# Patient Record
Sex: Male | Born: 1965
Health system: Southern US, Community
[De-identification: ages and names within clinical notes are randomized; demographics above are authoritative.]

## PROBLEM LIST (undated history)

## (undated) DIAGNOSIS — I10 Essential (primary) hypertension: Secondary | ICD-10-CM

## (undated) DIAGNOSIS — G4700 Insomnia, unspecified: Secondary | ICD-10-CM

## (undated) DIAGNOSIS — K52831 Collagenous colitis: Secondary | ICD-10-CM

## (undated) DIAGNOSIS — T7840XA Allergy, unspecified, initial encounter: Secondary | ICD-10-CM

## (undated) DIAGNOSIS — Z889 Allergy status to unspecified drugs, medicaments and biological substances status: Secondary | ICD-10-CM

## (undated) DIAGNOSIS — K859 Acute pancreatitis without necrosis or infection, unspecified: Secondary | ICD-10-CM

## (undated) DIAGNOSIS — N200 Calculus of kidney: Secondary | ICD-10-CM

## (undated) HISTORY — PX: CHOLECYSTECTOMY: SHX55

## (undated) HISTORY — DX: Essential (primary) hypertension: I10

## (undated) HISTORY — PX: COSMETIC SURGERY: SHX468

## (undated) HISTORY — DX: Collagenous colitis: K52.831

## (undated) HISTORY — DX: Insomnia, unspecified: G47.00

## (undated) HISTORY — PX: MANDIBLE SURGERY: SHX707

## (undated) HISTORY — DX: Allergy status to unspecified drugs, medicaments and biological substances: Z88.9

## (undated) HISTORY — DX: Allergy, unspecified, initial encounter: T78.40XA

## (undated) HISTORY — DX: Acute pancreatitis without necrosis or infection, unspecified: K85.90

## (undated) HISTORY — DX: Calculus of kidney: N20.0

---

## 2005-08-09 ENCOUNTER — Inpatient Hospital Stay (HOSPITAL_COMMUNITY): Admission: AD | Admit: 2005-08-09 | Discharge: 2005-08-11 | Payer: Self-pay | Admitting: Internal Medicine

## 2005-08-09 ENCOUNTER — Ambulatory Visit: Payer: Self-pay | Admitting: Internal Medicine

## 2005-08-18 ENCOUNTER — Ambulatory Visit: Payer: Self-pay | Admitting: Family Medicine

## 2005-08-31 ENCOUNTER — Encounter: Admission: RE | Admit: 2005-08-31 | Discharge: 2005-11-29 | Payer: Self-pay | Admitting: Internal Medicine

## 2005-09-22 ENCOUNTER — Ambulatory Visit: Payer: Self-pay | Admitting: Family Medicine

## 2005-10-27 ENCOUNTER — Ambulatory Visit: Payer: Self-pay | Admitting: Family Medicine

## 2005-11-24 ENCOUNTER — Ambulatory Visit: Payer: Self-pay | Admitting: Family Medicine

## 2006-08-05 ENCOUNTER — Ambulatory Visit: Payer: Self-pay | Admitting: Family Medicine

## 2006-12-20 ENCOUNTER — Ambulatory Visit: Payer: Self-pay | Admitting: Gastroenterology

## 2006-12-20 LAB — CONVERTED CEMR LAB
ALT: 14 units/L (ref 0–40)
AST: 14 units/L (ref 0–37)
Alkaline Phosphatase: 56 units/L (ref 39–117)
BUN: 15 mg/dL (ref 6–23)
Basophils Relative: 0.8 % (ref 0.0–1.0)
Bilirubin, Direct: 0.1 mg/dL (ref 0.0–0.3)
CO2: 30 meq/L (ref 19–32)
Calcium: 9.1 mg/dL (ref 8.4–10.5)
Chloride: 105 meq/L (ref 96–112)
Eosinophils Absolute: 0.1 10*3/uL (ref 0.0–0.6)
Eosinophils Relative: 2.4 % (ref 0.0–5.0)
GFR calc Af Amer: 108 mL/min
GFR calc non Af Amer: 89 mL/min
Glucose, Bld: 93 mg/dL (ref 70–99)
Lymphocytes Relative: 22.4 % (ref 12.0–46.0)
Monocytes Relative: 7.4 % (ref 3.0–11.0)
Neutro Abs: 4.1 10*3/uL (ref 1.4–7.7)
Platelets: 201 10*3/uL (ref 150–400)
RBC: 4.99 M/uL (ref 4.22–5.81)
WBC: 5.9 10*3/uL (ref 4.5–10.5)

## 2007-01-07 HISTORY — PX: OTHER SURGICAL HISTORY: SHX169

## 2007-02-01 ENCOUNTER — Ambulatory Visit: Payer: Self-pay | Admitting: Gastroenterology

## 2007-02-15 ENCOUNTER — Ambulatory Visit: Payer: Self-pay | Admitting: Family Medicine

## 2007-02-15 ENCOUNTER — Ambulatory Visit: Payer: Self-pay | Admitting: Internal Medicine

## 2007-02-15 LAB — CONVERTED CEMR LAB
Albumin: 3.5 g/dL (ref 3.5–5.2)
Alkaline Phosphatase: 50 units/L (ref 39–117)
BUN: 15 mg/dL (ref 6–23)
Basophils Absolute: 0 10*3/uL (ref 0.0–0.1)
Eosinophils Absolute: 0.2 10*3/uL (ref 0.0–0.6)
GFR calc Af Amer: 87 mL/min
GFR calc non Af Amer: 72 mL/min
HCT: 43.5 % (ref 39.0–52.0)
Hemoglobin: 15.1 g/dL (ref 13.0–17.0)
Lipase: 21 units/L (ref 11.0–59.0)
MCHC: 34.8 g/dL (ref 30.0–36.0)
MCV: 88.5 fL (ref 78.0–100.0)
Monocytes Absolute: 0.9 10*3/uL — ABNORMAL HIGH (ref 0.2–0.7)
Monocytes Relative: 7.8 % (ref 3.0–11.0)
Neutro Abs: 8.9 10*3/uL — ABNORMAL HIGH (ref 1.4–7.7)
Neutrophils Relative %: 77.8 % — ABNORMAL HIGH (ref 43.0–77.0)
Potassium: 4.2 meq/L (ref 3.5–5.1)
Sodium: 141 meq/L (ref 135–145)
Total Bilirubin: 1.1 mg/dL (ref 0.3–1.2)

## 2007-02-21 ENCOUNTER — Ambulatory Visit: Payer: Self-pay | Admitting: Gastroenterology

## 2007-03-20 ENCOUNTER — Ambulatory Visit: Payer: Self-pay | Admitting: Cardiology

## 2007-03-22 ENCOUNTER — Ambulatory Visit: Payer: Self-pay | Admitting: Gastroenterology

## 2007-05-15 ENCOUNTER — Ambulatory Visit: Payer: Self-pay | Admitting: Gastroenterology

## 2007-05-23 ENCOUNTER — Inpatient Hospital Stay (HOSPITAL_COMMUNITY): Admission: EM | Admit: 2007-05-23 | Discharge: 2007-05-28 | Payer: Self-pay | Admitting: Emergency Medicine

## 2007-05-26 ENCOUNTER — Ambulatory Visit: Payer: Self-pay | Admitting: Gastroenterology

## 2007-06-09 ENCOUNTER — Ambulatory Visit: Payer: Self-pay | Admitting: Gastroenterology

## 2007-07-21 ENCOUNTER — Encounter (INDEPENDENT_AMBULATORY_CARE_PROVIDER_SITE_OTHER): Payer: Self-pay | Admitting: Internal Medicine

## 2007-07-21 ENCOUNTER — Encounter: Payer: Self-pay | Admitting: Gastroenterology

## 2007-07-21 ENCOUNTER — Ambulatory Visit: Payer: Self-pay | Admitting: Gastroenterology

## 2007-07-21 LAB — HM COLONOSCOPY: HM Colonoscopy: NORMAL

## 2007-09-12 ENCOUNTER — Ambulatory Visit: Payer: Self-pay | Admitting: Gastroenterology

## 2007-10-25 DIAGNOSIS — R197 Diarrhea, unspecified: Secondary | ICD-10-CM | POA: Insufficient documentation

## 2008-01-01 DIAGNOSIS — K859 Acute pancreatitis without necrosis or infection, unspecified: Secondary | ICD-10-CM | POA: Insufficient documentation

## 2008-01-01 DIAGNOSIS — M359 Systemic involvement of connective tissue, unspecified: Secondary | ICD-10-CM | POA: Insufficient documentation

## 2008-01-01 DIAGNOSIS — N200 Calculus of kidney: Secondary | ICD-10-CM | POA: Insufficient documentation

## 2008-05-16 ENCOUNTER — Telehealth: Payer: Self-pay | Admitting: Gastroenterology

## 2008-05-16 ENCOUNTER — Inpatient Hospital Stay (HOSPITAL_COMMUNITY): Admission: AD | Admit: 2008-05-16 | Discharge: 2008-05-23 | Payer: Self-pay | Admitting: Internal Medicine

## 2008-05-16 ENCOUNTER — Ambulatory Visit: Payer: Self-pay | Admitting: Internal Medicine

## 2008-05-16 DIAGNOSIS — J189 Pneumonia, unspecified organism: Secondary | ICD-10-CM | POA: Insufficient documentation

## 2008-05-17 ENCOUNTER — Encounter (INDEPENDENT_AMBULATORY_CARE_PROVIDER_SITE_OTHER): Payer: Self-pay | Admitting: Internal Medicine

## 2008-05-21 ENCOUNTER — Ambulatory Visit: Payer: Self-pay | Admitting: Gastroenterology

## 2008-05-21 ENCOUNTER — Encounter (INDEPENDENT_AMBULATORY_CARE_PROVIDER_SITE_OTHER): Payer: Self-pay | Admitting: Internal Medicine

## 2008-05-22 ENCOUNTER — Encounter (INDEPENDENT_AMBULATORY_CARE_PROVIDER_SITE_OTHER): Payer: Self-pay | Admitting: Internal Medicine

## 2008-05-23 ENCOUNTER — Encounter (INDEPENDENT_AMBULATORY_CARE_PROVIDER_SITE_OTHER): Payer: Self-pay | Admitting: Internal Medicine

## 2008-05-29 ENCOUNTER — Ambulatory Visit (HOSPITAL_COMMUNITY): Admission: RE | Admit: 2008-05-29 | Discharge: 2008-05-29 | Payer: Self-pay | Admitting: Family Medicine

## 2008-05-29 ENCOUNTER — Ambulatory Visit: Payer: Self-pay | Admitting: Family Medicine

## 2008-05-29 DIAGNOSIS — B370 Candidal stomatitis: Secondary | ICD-10-CM | POA: Insufficient documentation

## 2008-05-30 ENCOUNTER — Encounter (INDEPENDENT_AMBULATORY_CARE_PROVIDER_SITE_OTHER): Payer: Self-pay | Admitting: Internal Medicine

## 2008-06-06 LAB — CONVERTED CEMR LAB
CO2: 30 meq/L (ref 19–32)
Calcium: 9.3 mg/dL (ref 8.4–10.5)
Chloride: 106 meq/L (ref 96–112)
Potassium: 4.4 meq/L (ref 3.5–5.1)
Sodium: 142 meq/L (ref 135–145)

## 2008-06-12 ENCOUNTER — Telehealth (INDEPENDENT_AMBULATORY_CARE_PROVIDER_SITE_OTHER): Payer: Self-pay | Admitting: Internal Medicine

## 2010-05-01 ENCOUNTER — Telehealth: Payer: Self-pay | Admitting: Gastroenterology

## 2010-05-01 ENCOUNTER — Telehealth: Payer: Self-pay | Admitting: Internal Medicine

## 2010-05-01 ENCOUNTER — Ambulatory Visit: Payer: Self-pay | Admitting: Internal Medicine

## 2010-05-02 ENCOUNTER — Inpatient Hospital Stay (HOSPITAL_COMMUNITY): Admission: EM | Admit: 2010-05-02 | Discharge: 2010-05-05 | Payer: Self-pay | Admitting: Emergency Medicine

## 2010-08-25 ENCOUNTER — Telehealth (INDEPENDENT_AMBULATORY_CARE_PROVIDER_SITE_OTHER): Payer: Self-pay | Admitting: *Deleted

## 2010-08-26 ENCOUNTER — Encounter (INDEPENDENT_AMBULATORY_CARE_PROVIDER_SITE_OTHER): Payer: Self-pay | Admitting: *Deleted

## 2010-08-31 ENCOUNTER — Ambulatory Visit: Payer: Self-pay | Admitting: Family Medicine

## 2010-08-31 DIAGNOSIS — R05 Cough: Secondary | ICD-10-CM

## 2010-08-31 DIAGNOSIS — R059 Cough, unspecified: Secondary | ICD-10-CM | POA: Insufficient documentation

## 2010-10-23 ENCOUNTER — Ambulatory Visit: Payer: Self-pay | Admitting: Family Medicine

## 2010-10-23 DIAGNOSIS — J320 Chronic maxillary sinusitis: Secondary | ICD-10-CM | POA: Insufficient documentation

## 2010-12-07 IMAGING — CR DG ABDOMEN 2V
2 series · 2 of 2 positions shown · non-contrast
Comparison: Abdomen films of 05/22/2008

CLINICAL DATA: Abdominal pain, nausea and vomiting

ABDOMEN - 2 VIEW

[w abdomen upright]
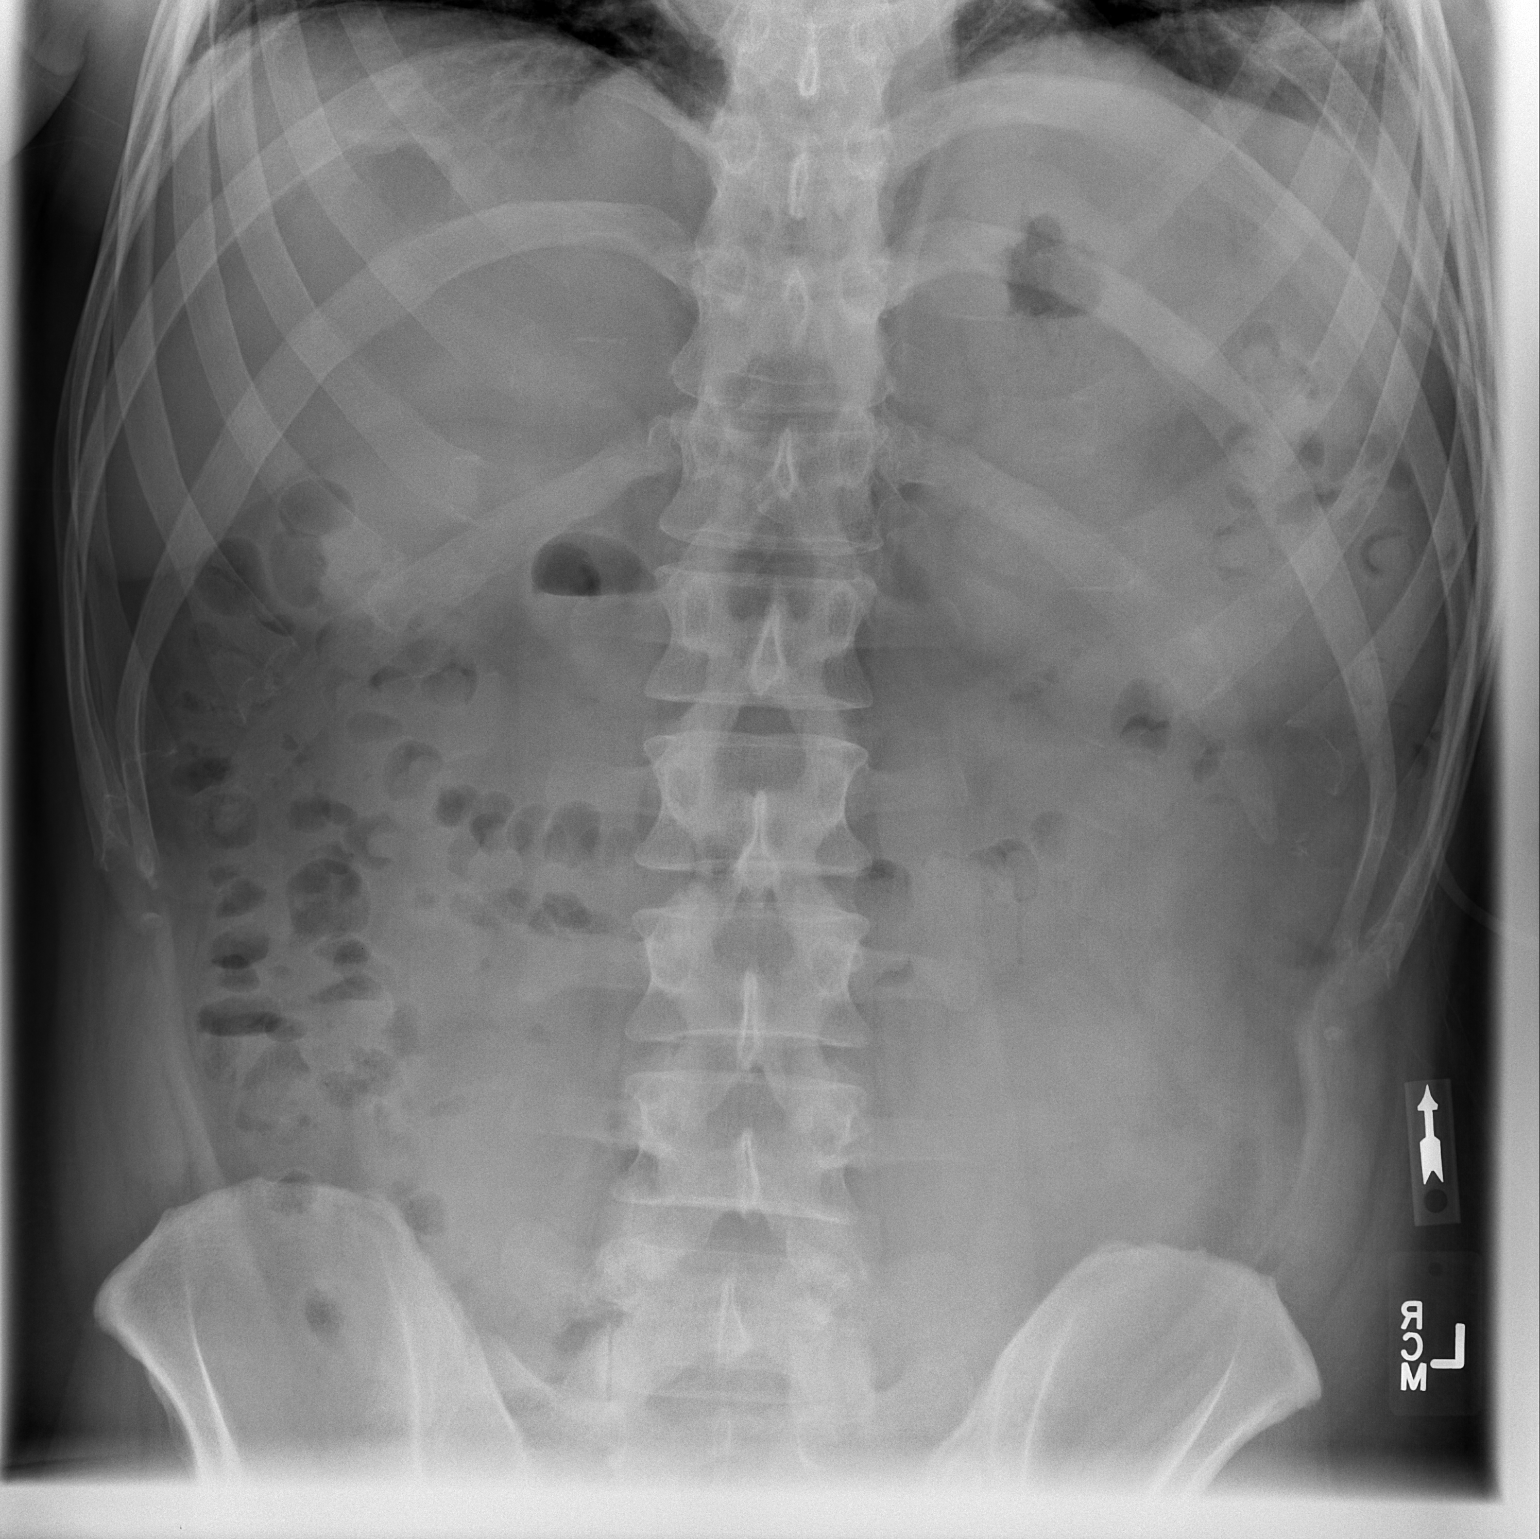

[t abdomen supine]
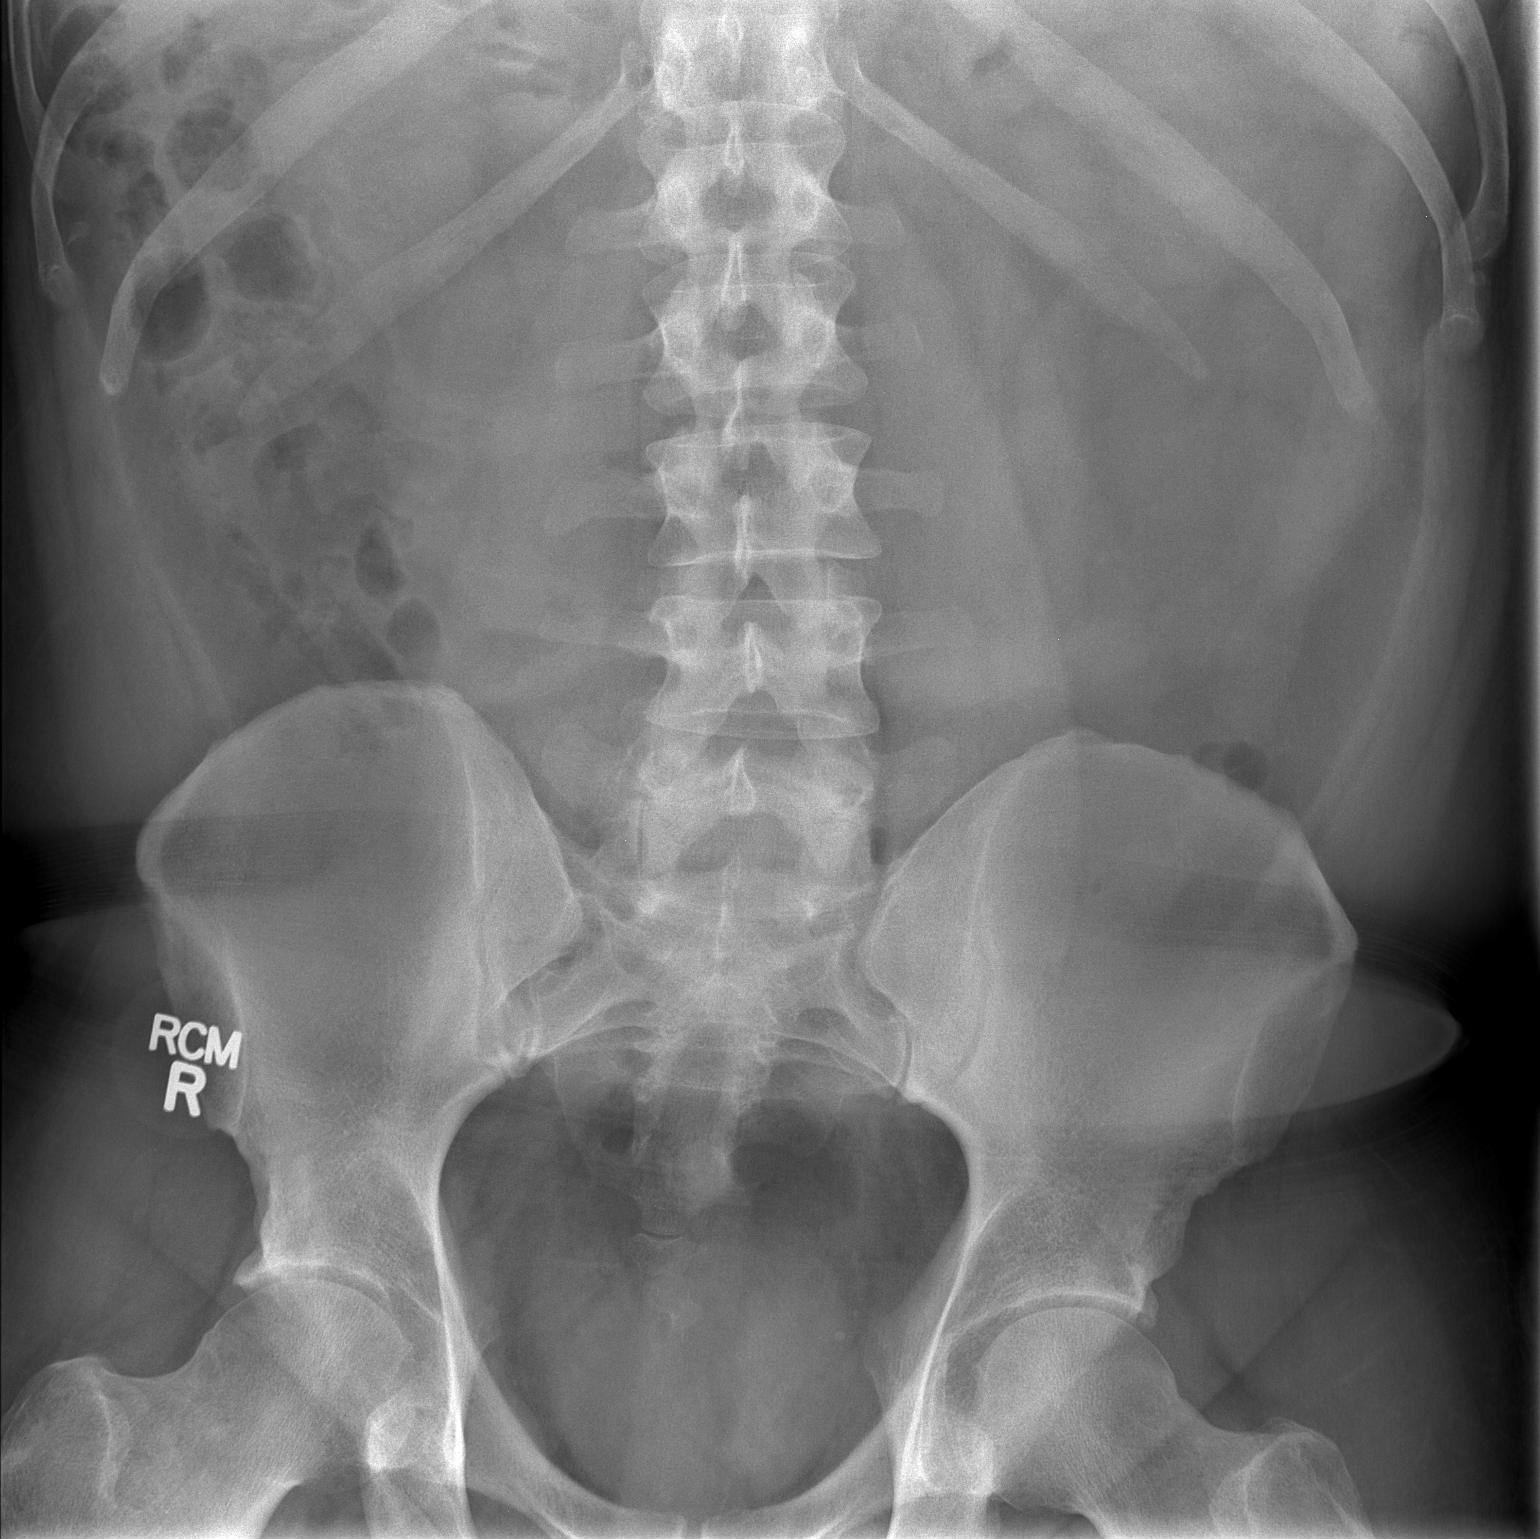

[2 of 2 positions shown; findings below may reference images not displayed]

FINDINGS: Supine and erect views the abdomen show no bowel
obstruction and no free air.  No opaque calculi are seen.  No bony
abnormality is noted.  There may be mild linear atelectasis or
scarring at the left lung base.
IMPRESSION: No bowel obstruction.  No free air.

## 2010-12-08 NOTE — Letter (Signed)
Summary: Results Letter  [facility]  [address] [place], [place] [address]   Phone: [phone]  Fax: [phone]        August 26, 2010 MRN: [id number]    ESTELL DILLINGER [address] [place], [place]  [address]    Dear Mr. SENGER,  We have been unable to reach you regarding your medication prescribed by Dr Christella Hartigan. Your pharmacy has denied coverage for this particular medicine. Please call at your earliest convenience to discuss further options.       Sincerely,  Chales Abrahams CMA (AAMA)  This letter has been electronically signed by your physician.  Appended Document: Results Letter letter mailed

## 2010-12-08 NOTE — Assessment & Plan Note (Signed)
Summary: COUGHING X 10 DAYS/DLO   Vital Signs:  Patient profile:   45 year old male Height:      71 inches Weight:      234.25 pounds BMI:     32.79 Temp:     97.3 degrees F oral Pulse rate:   84 / minute Pulse rhythm:   regular BP sitting:   110 / 80  (left arm) Cuff size:   large  Vitals Entered By: Delilah Shan CMA Geoge Lawrance Dull) (August 31, 2010 2:52 PM) CC: Coughing x 10 days, wheezing some.   History of Present Illness: ST on 08/22/10.  Then lost voice and had nasal congestion.  Fever 19th-21st along with aches and sweats.  Since weekend has had cough, wheeze, post nasal gtt but fever/sweats resolved.  ST returned "I think fromt the cough."  No help with OTC meds, has used mult antihistamines and robitussen.   Rhinorrhea is the biggest issue for patient now .  Current Medications (verified): 1)  Aleve 220 Mg  Caps (Naproxen Sodium) .... Take 1 Tablet By Mouth Two Times A Day As Needed 2)  Lansoprazole 30 Mg Cpdr (Lansoprazole) .Marland Kitchen.. 1 By Mouth Once Daily As Needed 3)  Multivitamins   Tabs (Multiple Vitamin) .... Take 1 Tablet By Mouth Once A Day  Allergies: 1)  ! 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    Review of Systems       See HPI.  Otherwise negative.    Physical Exam  General:  GEN: nad, alert and oriented HEENT: mucous membranes moist, TM w/o erythema, nasal epithelium injected with clear rhinorrhea, OP with cobblestoning but no exudates NECK: supple w/o LA CV: rrr. PULM: ctab, no inc wob ABD: soft, +bs EXT: no edema    Impression & Recommendations:  Problem # 1:  COUGH (ICD-786.2) We talked about options.  Start burst of oral steroids with GI/steroid precaution for rhinorrhea and use cough syrup in meantime.  Sedation caution given.  This should gradually resolve as it is likey viral. frontal and max sinuses not tender to palpation.  He agrees with plan.  Nontoxic.  follow up as needed.   Complete Medication List: 1)  Aleve 220 Mg Caps (Naproxen sodium)  .... Take 1 tablet by mouth two times a day as needed 2)  Lansoprazole 30 Mg Cpdr (Lansoprazole) .Marland Kitchen.. 1 by mouth once daily as needed 3)  Multivitamins Tabs (Multiple vitamin) .... Take 1 tablet by mouth once a day 4)  Prednisone 20 Mg Tabs (Prednisone) .... 2 tabs by mouth once daily for 5 days with food 5)  Hydrocodone-homatropine 5-1.5 Mg/87ml Syrp (Hydrocodone-homatropine) .... 5ml by mouth q6h as needed cough  Patient Instructions: 1)  I would use the cough syrup every 6 hours as needed.  It may make you drowsy.  Take the prednisone with food for 5 days.  Take care.  Let us know if you aren't feeling better.  Prescriptions: HYDROCODONE-HOMATROPINE 5-1.5 MG/5ML SYRP (HYDROCODONE-HOMATROPINE) 5ml by mouth q6h as needed cough  #6oz x 0   Entered and Authorized by:   Crawford Givens MD   Signed by:   Crawford Givens MD on 08/31/2010   Method used:   Print then Give to Patient   RxID:   [id number] PREDNISONE 20 MG TABS (PREDNISONE) 2 tabs by mouth once daily for 5 days with food  #10 x 0   Entered and Authorized by:   Crawford Givens MD   Signed by:   Crawford Givens MD on  08/31/2010   Method used:   Print then Give to Patient   RxID:   [id number]    Orders Added: 1)  Est. Patient Level III [14782]    Current Allergies (reviewed today): ! PCN

## 2010-12-08 NOTE — Progress Notes (Signed)
Summary: Triage   Phone Note Call from Patient   Caller: Caleb [name] [phone] Call For: Dr. Christella Hartigan Reason for Call: Talk to Nurse Summary of Call: Abd. pain, nausea...is thinking it could be possible pancreatitis Initial call taken by: Karna Christmas,  May 01, 2010 11:32 AM  Follow-up for Phone Call        Symptoms started yesterday.Having luq. pain,nausea,dry heaves.Can't keep p.o. intake down. Follow-up by: Teryl Lucy RN,  May 01, 2010 12:22 PM  Additional Follow-up for Phone Call Additional follow up Details #1::        has been recommended several times to have GB removed for sludge, recurrent acute pancreatitis felt to be biliary in origin.  I spoke with his wife on phone, he sounds to be in significant abd pain, unable to take by mouth (partly from mouth sorenss but also having nausea, dry heaves).  she will bring him to [facility] ER for stat labs, abd films.  This may be recurrent acute pancreatitis. Additional Follow-up by: Rachael Fee MD,  May 01, 2010 12:52 PM    Additional Follow-up for Phone Call Additional follow up Details #2::    message left to call back.  Teryl Lucy RN  May 01, 2010 12:55 PM

## 2010-12-08 NOTE — Progress Notes (Signed)
Summary: severe abd pain  Phone Note From Other Clinic   Caller: Susie at Dr Gari Crown- oral surgeon's office [phone] Summary of Call: Pt is as oral surgeon's office complaining of severe abd pain, back pain, nausea.  He has a hx of acute pancreatitis.  Oral surgeon wants pt directly admitted to hospital.  Per Dr Alphonsus Sias,  advised Shanda Bumps in oral surgeon's office that he will be unable to see pt with severe abd pain, pt needs to go to ER.  Dr. Alphonsus Sias cannot do direct admit without prior testing. Initial call taken by: Lowella Petties CMA,  May 01, 2010 9:49 AM  Follow-up for Phone Call        Dr. Sharren Bridge office called back and wife is asking if pt can be seen to get labs done? Wife asked again thru nurse if pt could be admitted then? I advised that lab results wouldn't be back until Sat or maybe Monday. She will call if they want the appt today. DeShannon Katrinka Blazing CMA Duncan Dull)  May 01, 2010 9:55 AM   yes---if acute sig pain, medically necessary that he be seen in ER where appropriate testing can be done in real time with decision making based on clearer diagnosis Follow-up by: Cindee Salt MD,  May 01, 2010 10:12 AM

## 2010-12-08 NOTE — Progress Notes (Signed)
Summary: Insurance denial for prevacid   Phone Note [employer] [facility] back at [employer] [phone]   Call placed by: Chales Abrahams CMA Duncan Dull),  August 25, 2010 11:04 AM Summary of Call: called pt to inform him that the prevacid sent to the pt is not covered by the insurance.  left message on machine to call back he needs to call insurance and see what is covered. Initial call taken by: Chales Abrahams CMA Duncan Dull),  August 25, 2010 11:05 AM  Follow-up for Phone Call        left message on machine to call back Chales Abrahams CMA Duncan Dull)  August 26, 2010 8:58 AM   letter mailed Follow-up by: Chales Abrahams CMA Duncan Dull),  August 26, 2010 4:47 PM     Appended Document: Insurance denial for prevacid pt returned call and will call his insurance and find out which PPI is covered and let me know so I can call it in for him

## 2010-12-10 NOTE — Assessment & Plan Note (Signed)
Summary: ?URI/CLE   Vital Signs:  Patient profile:   45 year old male Height:      71 inches Weight:      240.75 pounds BMI:     33.70 Temp:     98.2 degrees F oral Pulse rate:   80 / minute Pulse rhythm:   regular BP sitting:   120 / 82  (left arm) Cuff size:   large  Vitals Entered ByMelody Comas (October 23, 2010 4:01 PM) CC: URI   History of Present Illness: Started about 2 weeks ago.  L sinus pain and L upper tooth pain.  L ear pain.  No vomiting.  Occ chills/fever at night.  Waking up sweaty. Coughing fits at night.  Exposed to mult sick contacts.  No known flu expsoure.  Not sleeping well.    Current Medications (verified): 1)  Multivitamins   Tabs (Multiple Vitamin) .... Take 1 Tablet By Mouth Once A Day  Allergies: 1)  ! 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   Review of Systems       See HPI.  Otherwise negative.    Physical Exam  General:  GEN: nad, alert and oriented HEENT: mucous membranes moist, TM w/o erythema, nasal epithelium injected, OP with cobblestoning NECK: supple w/o LA CV: rrr. PULM: ctab, no inc wob ABD: soft, +bs EXT: no edema  L max sinus tender to palpation    Impression & Recommendations:  Problem # 1:  MAXILLARY SINUSITIS (ICD-473.0) L max sinusitis.  Start antibiotics and use cough syrup as needed with sedation caution.  supporitve tx o/w and follow up as needed. nontoxic.  The following medications were removed from the medication list:    Hydrocodone-homatropine 5-1.5 Mg/4ml Syrp (Hydrocodone-homatropine) .Marland KitchenMarland KitchenMarland KitchenMarland Kitchen 5ml by mouth q6h as needed cough His updated medication list for this problem includes:    Zithromax 250 Mg Tabs (Azithromycin) .Marland Kitchen... 2 by mouth today then 1 by mouth once daily for 4 days    Hydrocodone-homatropine 5-1.5 Mg/52ml Syrp (Hydrocodone-homatropine) .Marland KitchenMarland KitchenMarland KitchenMarland Kitchen 5 ml by mouth q6h as needed for cough with sedation caution  Complete Medication List: 1)  Multivitamins Tabs (Multiple vitamin) ....  Take 1 tablet by mouth once a day 2)  Zithromax 250 Mg Tabs (Azithromycin) .... 2 by mouth today then 1 by mouth once daily for 4 days 3)  Hydrocodone-homatropine 5-1.5 Mg/71ml Syrp (Hydrocodone-homatropine) .... 5 ml by mouth q6h as needed for cough with sedation caution  Patient Instructions: 1)  Get plenty of rest, drink lots of clear liquids, and use Tylenol or Ibuprofen for fever and comfort. Start the antibiotics today and use the cough medicine as needed.  It can make you drowsy.  Take care.  Prescriptions: HYDROCODONE-HOMATROPINE 5-1.5 MG/5ML SYRP (HYDROCODONE-HOMATROPINE) 5 ml by mouth q6h as needed for cough with sedation caution  #6oz x 0   Entered and Authorized by:   Crawford Givens MD   Signed by:   Crawford Givens MD on 10/23/2010   Method used:   Print then Give to Patient   RxID:   [id number] ZITHROMAX 250 MG TABS (AZITHROMYCIN) 2 by mouth today then 1 by mouth once daily for 4 days  #6 x 0   Entered and Authorized by:   Crawford Givens MD   Signed by:   Crawford Givens MD on 10/23/2010   Method used:   Print then Give to Patient   RxID:   [id number]    Orders Added: 1)  Est. Patient Level III [  99213]    Current Allergies (reviewed today): ! PCN

## 2011-01-24 LAB — CBC
HCT: 36.5 % — ABNORMAL LOW (ref 39.0–52.0)
HCT: 39.8 % (ref 39.0–52.0)
MCH: 29.4 pg (ref 26.0–34.0)
MCHC: 32.8 g/dL (ref 30.0–36.0)
MCV: 89.5 fL (ref 78.0–100.0)
RBC: 4.48 MIL/uL (ref 4.22–5.81)
RDW: 13.2 % (ref 11.5–15.5)
RDW: 13.4 % (ref 11.5–15.5)
WBC: 11.6 10*3/uL — ABNORMAL HIGH (ref 4.0–10.5)

## 2011-01-24 LAB — COMPREHENSIVE METABOLIC PANEL
ALT: 28 U/L (ref 0–53)
Albumin: 3.6 g/dL (ref 3.5–5.2)
Alkaline Phosphatase: 44 U/L (ref 39–117)
Alkaline Phosphatase: 49 U/L (ref 39–117)
BUN: 11 mg/dL (ref 6–23)
Calcium: 8.3 mg/dL — ABNORMAL LOW (ref 8.4–10.5)
Chloride: 106 mEq/L (ref 96–112)
Glucose, Bld: 116 mg/dL — ABNORMAL HIGH (ref 70–99)
Glucose, Bld: 79 mg/dL (ref 70–99)
Potassium: 4.2 mEq/L (ref 3.5–5.1)
Sodium: 140 mEq/L (ref 135–145)
Total Protein: 6.1 g/dL (ref 6.0–8.3)
Total Protein: 7 g/dL (ref 6.0–8.3)

## 2011-01-24 LAB — AMYLASE: Amylase: 30 U/L (ref 0–105)

## 2011-01-24 LAB — DIFFERENTIAL
Basophils Relative: 0 % (ref 0–1)
Eosinophils Absolute: 0.2 10*3/uL (ref 0.0–0.7)
Monocytes Absolute: 0.8 10*3/uL (ref 0.1–1.0)
Neutro Abs: 9.1 10*3/uL — ABNORMAL HIGH (ref 1.7–7.7)

## 2011-03-23 NOTE — Assessment & Plan Note (Signed)
[facility]                         GASTROENTEROLOGY OFFICE NOTE   KNIGHT, OELKERS                      MRN:          [id number]  DATE:05/15/2007                            DOB:          10/24/1966    GI PROBLEM LIST:  1. 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  Extensive workup      at [facility] in 2000 and 2004:  TTG negative, thyroid testing normal,      VIP normal, 5-HIAA testing normal, colonoscopy performed March 2004      normal endoscopically, but biopsies suggested collagenous colitis.      Continuing Anticort 3 pills daily.  Decreased Entocort to 2 pills      daily.  Added Pepto-Bismol but he had a lot of gas discomfort.  2. Acute abdominal pain, nausea, late March 2008, self-limited.  Had      CT scan during this, showing some small nodes in the jejunal      mesentery and subtly increased mesenteric fat, suspicious for      mesenteric adenitis.  Repeat CT scan, May 2008, showed resolution      of all the above findings.   INTERVAL HISTORY:  I last saw Mr. Harrower 6 weeks ago.  He is still  bothered by intermittent loose stools and intermittent solid stools.  Urgency still seems to be an issue for him; sometimes he will see mucus.  He has had no fever or chills.  He tried adding Pepto-Bismol to his  daily routine and that seemed to cause just too much gas.  He added  Imodium on an as needed basis.   Weight not taken.  Blood pressure 112/80, pulse 68.  CONSTITUTIONAL:  Generally well-appearing.  ABDOMEN:  Soft, nontender, nondistended. Normal bowel sounds.   ASSESSMENT AND PLAN:  This 45 year old man with chronic loose stools  likely  microscopic colitis.   He is on Entocort 2 pills a day.  He is still bothered by loose stools.  I believe the Pepto-Bismol caused a lot of gas discomforts and so he  will continue 2 pills of Entocort daily and add scheduled Imodium, 1  pill in the morning and 1 pill in the early afternoon.  He  will return  to see me in 4 weeks' time.  If he is not significantly better at this  point, I think we should repeat his colonoscopy.  He has not had one in  4 years and perhaps he has a more macroscopic type colitis.  At this  point, I think that is unlikely and his complaints seem to be somewhat  functional in nature at this point, but that would be my plan.     Rachael Fee, MD  Electronically Signed    DPJ/MedQ  DD: 05/15/2007  DT: 05/15/2007  Job #: [id number]   cc:   Billie D. Bean, FNP

## 2011-03-23 NOTE — Assessment & Plan Note (Signed)
[facility]                         GASTROENTEROLOGY OFFICE NOTE   Caleb [name], Caleb [name]                      MRN:          [id number]  DATE:06/09/2007                            DOB:          1966-07-17    PRIMARY CARE Caleb [name]:  [facility].   GI PROBLEM LIST:  1. 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  Extensive workup      at [facility] in 2000 and 2004:  TTG negative, thyroid testing normal,      VIP normal, 5-HIAA testing normal, colonoscopy performed March 2004      normal endoscopically, but biopsies suggested collagenous colitis.      Continuing Anticort 3 pills daily.  Decreased Entocort to 2 pills      daily.  Added Pepto-Bismol but he had a lot of gas discomfort.  2. Acute abdominal pain, nausea, late March 2008, self-limited.  Had      CT scan during this, showing some small nodes in the jejunal      mesentery and subtly increased mesenteric fat, suspicious for      mesenteric adenitis.  Repeat CT scan, May 2008, showed resolution      of all the above findings.  3. Acute pancreatitis, hospitalized July, 2008.  CT scan confirmed      mild pancreatic inflammation, elevated amylase and lipase.  In      retrospect, his CT, March, 2008, also showed some very mild      pancreatic inflammation.  I suspect this is recurrent pancreatitis.      Ultrasound performed showed no definite stones, but there was      sludge in his gallbladder.  I suspect he is having biliary      pancreatitis.   INTERVAL HISTORY:  I last saw Caleb [name] when he was hospitalized at  [facility] for the mild acute pancreatitis.  He stayed 4-5  nights and then went home.  I advised him at that time to have  laparoscopic cholecystectomy.  Surgical consultation was performed and  they agreed, but the patient declined the surgery as he is worried about  worsening of his chronic diarrhea.  I explained that it is true that  many people have post-cholecystectomy  diarrhea that is usually fairly  easy to control with cholestyramine.  We compromised and started him on  Actigall.  He was on this for several days, but then stopped as he was  having a worsening of his chronic loose stools.  He said shortly after  stopping his bowel movements went from 8 a day down to 3-4 a day which  is more his usual.   PHYSICAL EXAMINATION:  Weight 233 pounds, blood pressure 124/82, pulse  88.  CONSTITUTIONAL:  Generally well appearing.  ABDOMEN:  Soft, nontender, nondistended, normal bowel sounds.   ASSESSMENT AND PLAN:  A 45 year old man with likely microscopic colitis,  see above. Recurrent acute pancreatitis, likely from biliary process  (sludge in gallbladder).   I again recommended that he reconsider laparoscopic cholecystectomy.  He  understands that pancreatitis can be very severe, but still declined at  this  point out of concern for possible post-cholecystectomy loose  stools.  It seems unlikely that the Actigall created his loose stools  recently, as on review of literature this has happened, actually, less  than placebo and head-to-head study.  My plan is to repeat colonoscopy  in the next week or two to reevaluate his colon.  He had a colonoscopy at [facility] 2004.  I just want to make sure that we are  not dealing with something else that is just possibly Crohn's disease  now.     Rachael Fee, MD  Electronically Signed    DPJ/MedQ  DD: 06/09/2007  DT: 06/09/2007  Job #: [id number]   cc:   Willaim Sheng D. Jillyn Hidden, FNP  Sandria Bales. Ezzard Standing, M.D.

## 2011-03-23 NOTE — H&P (Signed)
Caleb, [name] NO.:  [id number]   MEDICAL RECORD NO.:  [id number]          PATIENT TYPE:  INP   LOCATION:  5501                         FACILITY:  [facility]   PHYSICIAN:  Rachael Fee, MD   DATE OF BIRTH:  03/31/1966   DATE OF ADMISSION:  05/23/2007  DATE OF DISCHARGE:                              HISTORY & PHYSICAL   PRIMARY CARE:  [facility] [place] D. Bean, FNP.   CHIEF COMPLAINT:  Abdominal pain.   HISTORY OF PRESENT ILLNESS:  Caleb [name] is a 45 year old white male.  He  has a history of microscopic colitis diagnosed at colonoscopy 4 years  ago at [facility].  He had an extensive lab workup including tissue  transglutaminase, VIP study, five HIAA and thyroid testing, all of which  were normal.  His baseline is that of 5-6 loose bowel movements daily.  He is on chronic Entocort and Imodium.  This past spring he had acute  upper abdominal pain.  A CT scan was done in April 2008 that showed some  small nodes in the jejunal mesentery and subtly increased mesenteric  back density.  Dr. Christella Hartigan thought that this might represent mesenteric  adenitis.  Also on that study, there was some ill-defined edema in the  pancreatic head region, possibly related to inflammatory process at the  root of the small bowel mesentery.  His pancreatic enzymes and LFTs at  that time were normal.  The patient's symptoms improved with use of  Phenergan and Percocet.  The patient had a follow-up CT scan in May 2008  which showed resolution of the abnormal findings.   The patient called the office this morning and was sent to the ER with  complaints of acute severe upper abdominal pain.  This radiates somewhat  into the lower abdomen and into the back.  He has had some nausea, no  emesis.  Bowel movements actually today have been formed.  Imaging of  acute abdominal series here in the ER shows no acute specific changes.  There is some low-level inspiration on the chest films,  so the  radiologist could not rule out pneumonia versus atelectasis, but he is  not having any shortness of breath, cough or fever.  CBC and C-met are  normal as is the urinalysis.  However, his lipase is 142 and his amylase  is 242.  Thus suggesting that he has a pancreatitis.   MEDICAL ALLERGIES:  Apparently when he was a young child, he had some  sort of reaction to penicillin.  He is not sure what it was.  He does  avoid NSAIDs because they flare up his colitis, but he is not allergic  to them.   MEDICATIONS:  1. Entocort 6 mg daily.  2. Imodium one p.o. b.i.d.   PAST MEDICAL HISTORY:  1. Microscopic colitis.  2. Question mesenteric adenitis/mesenteric edema.  3. Allergic rhinitis.  4. Nephrolithiasis.  5. Vertigo and acute labyrinthitis.  6 . Obesity.   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  He does not smoke.  He drinks  very scant amounts of alcohol on infrequent occasions.   FAMILY HISTORY:  His mother has a Wegener's disease and generally there  is hypertension in siblings and uncles and aunts.   REVIEW OF SYSTEMS:  Does not have any reflux symptoms, generally.  No  weight change, no chest pain.  No headaches.  No shortness of breath.  No cough.  No limb weakness.  No dizziness, no urinary symptoms.  No  rashes.   LABORATORY:  Sodium 137, potassium 3.7, chloride 104, CO2 28.  BUN 15,  creatinine 0.9.  Glucose 89.  Amylase 242, lipase 149.  Urinalysis is  negative.  A total bilirubin 0.6, alkaline phosphatase 46, AST 17, ALT  14.   PHYSICAL EXAMINATION:  VITAL SIGNS:  Blood pressure 129/85, pulse 81,  respirations 22, room air saturation 99%, temperature 97.2.  GENERAL:  The patient is obese white male in acute pain.  HEENT:  Nonicteric.  No pallor.  Extraocular movements intact.  Oropharynx mucosa is moist and clear.  Dentition in fair repair.  NECK:  No masses, no JVD.  PULMONARY:  Chest is  clear to auscultation and percussion bilaterally.  CARDIOVASCULAR:  There is regular rate and rhythm.  No murmurs, rubs or  gallops.  GI: Abdomen is soft with hypoactive bowel sounds.  There is marked  tenderness, especially in the upper quadrants bilaterally, but somewhat  in the lower quadrants.  There is no guarding or rebound.  RECTAL:  Exam was limited, but there is a small amount of stool and this  is fecal occult blood negative.  No masses present.  EXTREMITIES:  No cyanosis, clubbing or edema.  NEUROLOGIC:  The patient is alert and oriented x3.  PSYCHIATRIC:  Patient is cooperative and appropriate, though obviously  in distress with pain.   IMPRESSION:  1. Acute pancreatitis, question etiology, question secondary to      elevated triglycerides, question pancreatic disease, question      choledocholithiasis, though he has not had this suggested by a      prior CT scan.  Interestingly, his previous CT of April 2008 did      suggest some changes in the head of the pancreas and perhaps that      actually represented some pancreatitis at the time, though his      amylase and lipase were apparently normal then.  2. History of microscopic colitis.  3. Rule out hyperlipidemia.  4. Dyslipidemia.   PLAN:  Admit the patient.  Will get a CT scan of the abdomen and pelvis.  Provide p.r.n. Dilaudid.  Hydrate well with IV fluids.  Keep on clear  liquids and check a lipid profile.      Jennye Moccasin, PA-C      Rachael Fee, MD  Electronically Signed   SG/MEDQ  D:  05/23/2007  T:  05/24/2007  Job:  [id number]

## 2011-03-23 NOTE — Discharge Summary (Signed)
Caleb [name], Caleb [name] NO.:  [id number]   MEDICAL RECORD NO.:  [id number]          PATIENT TYPE:  INP   LOCATION:  5531                         FACILITY:  [facility]   PHYSICIAN:  Hedwig Morton. Juanda Chance, MD     DATE OF BIRTH:  15-May-1966   DATE OF ADMISSION:  05/23/2007  DATE OF DISCHARGE:  05/28/2007                               DISCHARGE SUMMARY   ADMISSION DIAGNOSES:  1. Acute pancreatitis of yet to be determined etiology.  Possible      causes include hypertriglyceridemia, gallbladder sludge or stones,      idiopathic.  Patient not taking medications that would be causing      the pancreatitis, so unlikely this is medication-induced      pancreatitis.  2. History of microscopic colitis with chronic diarrhea.  3. Mesenteric adenitis and suspected cause of increased mesenteric fat      density and mesenteric jejunal adenopathy on CT scan of February 15, 2007.  4. Hyperlipidemia.  5. History of nephrolithiasis.  6. Allergic rhinitis with associated acute labyrinthitis and vertigo.  7. Obesity.  8. Lung CT view of April 9 showed some nodular changes in the lungs,      possibly representing atelectasis.  On Followup CT of May 12, these      changes had resolved.   DISCHARGE DIAGNOSIS:  Acute pancreatitis, presumed biliary as sludge was  seen on the gallbladder ultrasound.   CONSULTATIONS:  General surgery, Dr. Ovidio Kin.   PROCEDURES:  None.   BRIEF HISTORY:  Caleb [name] is a 45 year old gentleman.  He was diagnosed  with microscopic colitis 4 years ago at [facility].  Workup included a  colonoscopy which was grossly normal, but biopsies were suspicious for  microscopic colitis.  He had extensive lab work including tissue  transglutaminase, VIP, 5HIAA, and thyroid testing.  All this lab work  was normal.  The patient takes Entocort and Imodium chronically but  still complains of 5-6 loose, nonbloody bowel movements a day.   The patient had a bout of acute abdominal  pain in April, and a CT scan  at that time showed some mild adenopathy in the jejunal mesentery as  well as slightly increased mesenteric fat density. There was suspicion  for mesenteric adenitis.  Also on a CT was some ill-defined abnormality  in the pancreatic head and uncinate process.  This was not read as  pancreatitis.  He had a followup CT in May showing resolution of all the  abnormal findings. Indeed, even a lingular nodularity seen on the chest  x-ray of April 9 was no longer present.   This at this time of May 23, 2007, the patient was referred to the  emergency room because he was complaining of acute severe upper  abdominal pain radiating into the back and lower abdomen.  Bowel  movements were unchanged.  There was nausea but no emesis.  Plain  abdominal films in the ER were not specific.  His lipase was somewhat  elevated at 142, and amylase was 242, suggesting that the reason for  the  patient's symptoms was pancreatitis.  In retrospect looking back at that  previous April CT, that may have represented pancreatitis at that time.  The patient was admitted to the hospital for supportive care and further  diagnostic studies.   LABORATORY DATA:  Hemoglobin 14.8, corrected to 12.7.  Hematocrit 43.5,  37.1 at discharge.  MCV 88.  Platelets ranging 149-174.  Maximum white  blood cell count 13.7, 9.9 at discharge.  Differential within normal  limits..  Sodium 137, potassium 3.7, chloride 104, CO2 28.  Glucose  ranged 89-123.  BUN ranging 5-15, and creatinine ranging 0.88-1.0.  Glomerular filtration rate greater than 60.  Total bilirubin max 1.5,  1.0 at discharge.  Alkaline phosphatase 48, AST 17, ALT 14.  Albumin  2.8, total protein 6.0.  Amylase 242 on admission, 131 at recheck.  Lipase 149 at admission, 58 at recheck.  Total cholesterol 173,  triglycerides 117, HDL cholesterol 52, VLDL cholesterol 23, LDL  cholesterol 98.  The ratio of total to HDL cholesterol was 3.3.   Urinalysis negative.   IMAGING STUDIES:  Acute abdominal series showed a low level of  inspiration with increased bibasilar markings, question secondary to  volume loss or secondary to pneumonia versus subsegmental atelectasis.   CT scan of the abdomen and pelvis showed inflammation of the head and  uncinate process consistent with pancreatitis.  Nodular densities in the  lungs, some probably representing atelectasis but some have a more  rounded appearance, so followup CT recommended to confirm clearing of  findings.  There was nothing acute seen in the pelvic views on CT scan.   Abdominal ultrasound of the abdomen May 25, 2007, showed gallbladder  sludge, no acute cholecystitis.  The common bile duct upper limits of  normal at 6.4 mm and pancreas poorly visualized.   HOSPITAL COURSE:  #1.  ACUTE PANCREATITIS:  The patient was provided supportive care with  aggressive IV fluid hydration, bowel rest and Dilaudid PCA pump.  He did  have a couple of fevers on day #1 and #2 of his hospital stay with  maximum temperature of 102.3 on July 16.  He was not started on  antibiotics.  Pancreatitis in and of itself cause fever, and atelectasis  as well could have been the cause for the fever.   The patient symptoms slowly improved.  Pain was his biggest complaint.  Abdominal exam by hospital day #2 was unremarkable with only slight  abdominal tenderness.  His diet was slowly advanced to low-fat diet  which he tolerated.   Etiology of the pancreatitis was felt to be biliary.  Dr. Ezzard Standing  discussed cholecystectomy with the patient.  The patient wished not to  have his gallbladder removed because there was a chance that it could  worsen his already chronic diarrhea.  He was reminded that pancreatitis,  should it recur, can be quite severe and even cause death.  He  understands this but still wished not to proceed to cholecystectomy.  Instead in its place, Actigall therapy was initiated.   #2.   NODULARITY IN THE LUNGS:  This may represent atelectasis.  He was  given incentive spirometer to use.  Plan will be determined outpatient  as to whether or not we need to get another CT scan to follow up to the  lung changes.   #3.  MICROSCOPIC COLITIS:  The patient's colitis causes him to have  chronic diarrhea.  He somewhat controls this with Imodium twice daily  and chronic  Endocort.  He still manages have 5-6 loose stools a day.   CONDITION ON DISCHARGE:  Stable and improved.   DISCHARGE MEDICATIONS:  1. Endocort  6 mg daily.  2. Actigall 300 mg twice daily.  3. Vicodin 5/500 as needed for pain.  4. Imodium two p.o. daily   DISCHARGE DIET:  Low-fat.   FOLLOWUP APPOINTMENTS:  Followup appointment with Dr. Christella Hartigan on Friday,  August 1, as at 8:45 a.m. Call the office for the appointments. We were  unable to make the appointment since he was discharged over the weekend.      Jennye Moccasin, PA-C      Hedwig Morton. Juanda Chance, MD  Electronically Signed    SG/MEDQ  D:  05/28/2007  T:  05/28/2007  Job:  [id number]   cc:   Everrett Coombe, FNP, [place] [address]  Rachael Fee, MD  Sandria Bales. Ezzard Standing, M.D.

## 2011-03-23 NOTE — Consult Note (Signed)
NAMEMERREL, CRABBE NO.:  [id number]   MEDICAL RECORD NO.:  [id number]          PATIENT TYPE:  INP   LOCATION:  1521                         FACILITY:  [facility]   PHYSICIAN:  Thornton Park. Daphine Deutscher, MD  DATE OF BIRTH:  26-Oct-1966   DATE OF CONSULTATION:  05/20/2008  DATE OF DISCHARGE:                                 CONSULTATION   CHIEF COMPLAINT:  Recurrent pancreatitis.   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  He has had problems with increased stool frequency and  had been worked up over at [facility].  It was felt that he likely had acute  pancreatitis and was admitted here and CT scan has apparently  corroborated that.   OTHER MEDICAL ISSUES:  1. Microscopic colitis  2. GERD.   We discussed a laparoscopic cholecystectomy including complications not  limited to common duct injury.  I think he had seen Dr. Ovidio Kin in  my practice about a year ago and we had talked about this.  His present  condition, he is still uncomfortable, having difficulty with liquids.  I  think that this is something we should leg him get over this acute  attack and would consider elective cholecystectomy when he gets his  business schedules in order.   IMPRESSION:  Probable gallbladder sludge with microlithiasis as a cause  of recurrent pancreatitis.  Agree with elective laparoscopic  cholecystectomy.      Thornton Park Daphine Deutscher, MD  Electronically Signed     MBM/MEDQ  D:  05/20/2008  T:  05/20/2008  Job:  [id number]

## 2011-03-23 NOTE — Assessment & Plan Note (Signed)
[facility]                         GASTROENTEROLOGY OFFICE NOTE   JUSTN, QUALE                      MRN:          [id number]  DATE:03/22/2007                            DOB:          01/28/1966    GI PROBLEM LIST:  1. 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  Extensive workup      at [facility] in 2000 and 2004:  TTG negative, thyroid testing normal,      VIP normal, 5-HIAA testing normal, colonoscopy performed March 2004      normal endoscopically, but biopsies suggested collagenous colitis.      Continuing Anticort 3 pills daily.  2. Acute abdominal pain, nausea, late March 2008, self-limited.  Had      CT scan during this, showing some small nodes in the jejunal      mesentery and subtly increased mesenteric fat, suspicious for      mesenteric adenitis.  Repeat CT scan, May 2008, showed resolution      of all the above findings.   INTERVAL HISTORY:  I last saw Mr. Alms four weeks ago.  He had a  repeat CT scan, showing interval resolution of all the CT findings on  the previous scan.  He has had no return of the pains that led to that.  I suspect that he had a viral gastroenteritis.  He is still bothered by  chronic loose stools.  He goes generally two to three times a day with  some mild urgency.  This is while taking Entocort 9 mg daily.  He does  say that this urgency gets in the way of some of his daily activities.   Weight 239 pounds, up 2 pounds since his last visit.  Blood pressure  120/68, pulse 96.  CONSTITUTIONAL:  Generally well-appearing.  ABDOMEN:  Soft, nontender, nondistended.  Normal bowel sounds.   ASSESSMENT AND PLAN:  A 45 year old man with chronic loose stools,  likely microscopic colitis.   I want to start backing down on his Entocort to two pills a day.  He  will stay on two pills a day until he sees me again in about six weeks.  He will add Pepto Bismol one to two pills twice daily.  If he is still  bothered by urgency, I will consider adding Imodium to this regimen.  He  has already tried probiotics and yogurts.  I explained to him that three  times a day is considered in the range of normal, but he is still  bothered by relative urgency that does seem to get in the way of some of  his daily activities.     Rachael Fee, MD  Electronically Signed    DPJ/MedQ  DD: 03/22/2007  DT: 03/22/2007  Job #: [id number]   cc:   Billie D. Bean, FNP

## 2011-03-23 NOTE — Consult Note (Signed)
NAMELEVOY, GEISEN               ACCOUNT NO.:  [id number]   MEDICAL RECORD NO.:  [id number]          PATIENT TYPE:  INP   LOCATION:  5531                         FACILITY:  [facility]   PHYSICIAN:  Sandria Bales. Ezzard Standing, M.D.  DATE OF BIRTH:  January 17, 1966   DATE OF CONSULTATION:  05/25/2007  DATE OF DISCHARGE:                                 CONSULTATION   PRIMARY CARE PHYSICIAN:  [facility].   REASON FOR CONSULTATION:  Pancreatitis, suspected biliary etiology.   HISTORY OF PRESENT ILLNESS:  A 45 year old male patient with history of  microscopic colitis, diagnosed at [facility], who has persistent frequent  diarrheal stools.  He was admitted with mild pancreatitis without  pseudocyst.  The patient has a history of abdominal pain most recently  dating back to April 2008.  CT scan was done at that time, and it was  felt that the patient had mesenteric adenitis.  On that same CT scan,  the patient was found to have an ill-defined edema around the pancreatic  head, and it was suspected this could be due to an inflammatory process  at the root of the small bowel mesentery.  Labs at that time were  normal.  Followup CT in May 2008 showed resolution to these prior CT  changes.   The patient has subsequently redeveloped the same upper abdominal pain  on 05/23/2007.  This is similar to his pain in April 2008, although much  more intense.  He describes this as an epigastric pain that feels like  something is stuck in his throat and a constant band-like midabdominal  pain radiating out from the periumbilical region backwards.  He was seen  initially at [facility] and was sent over to [facility] for  admission after laboratory values returned positive for low grade  pancreatitis.  His initial lipase was 149.  Amylase was 242.   On 07/16, after bowel rest and IV fluids, his lipase was down to 58.  Amylase was down to 31.  His LFTs have been normal.  Upon admission, his  initial  white count was 10,000.  Yesterday it was 13,700 and today it is  13,400.  GI has been evaluating the patient to determine the etiology of  the pancreatitis.  He apparently has had normal triglycerides recently.  He does not use alcohol in excess.   Ultrasound was done today that demonstrated normal gallbladder wall  thickness, dependent sludge in the neck of the gallbladder.  No  definite cholelithiasis or pericholecystic fluid that would be  consistent with cholecystitis, but his common bile duct was somewhat  dilated at 6.4 mm.  It is important to note that at admission, the  patient did have a CT of the abdomen and pelvis that confirmed  pancreatitis of the head and uncinate process of the pancreas.  His SMA  and SMV were patent.  Because of the findings of sludge and mild common  bile duct dilatation and the patient with abdominal pain, nausea,  vomiting and pancreatitis, suspect the patient may have a biliary  etiology to the pancreatitis and surgical  evaluation has been requested.   REVIEW OF SYSTEMS:  As above.  Again, the patient states his pain is  better controlled with the Dilaudid but is still present, persistent and  band-like, periumbilical, radiating to the back.  He has had nausea and  vomiting in the past with this and he had some fevers up to 103  yesterday.   PAST MEDICAL HISTORY:  1. Microscopic colitis, diagnosed at [facility], with persistent chronic      diarrhea.  2. Mesenteric edema with suspected mesenteric adenitis in April 2008.  3. Allergic rhinitis.  4. Nephrolithiasis.  5. Vertigo, secondary to acute labyrinthitis.  6. Obesity.   PAST SURGICAL HISTORY:  Tonsillectomy as a child.   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  His wife is also a Publishing rights manager who works  with [employer].  The patient does not utilize tobacco products.  He does not utilize alcohol except on very rare basis.    ALLERGIES:  PENICILLIN, CHILDHOOD REACTION.  THE PATIENT IS DOES NOT  KNOW REACTION.   HOME MEDICATIONS:  1. Entocort 6 mg daily.  2. Imodium b.i.d.   HOSPITAL MEDICATIONS:  Medications at the hospital include same home  medications plus:  1. Dilaudid PCA.  2. Protonix p.o.  3. Lovenox for DVT prophylaxis.  4. IV fluids and various other p.r.n. medications to treat nausea and      fever.   PHYSICAL EXAMINATION:  GENERAL:  Pleasant, somewhat frustrated male  patient, complaining of band-like abdominal pain and nausea and  vomiting.  VITAL SIGNS:  Temperature 96.8, BP 139/93, pulse 103 and regular,  respirations 18.  In review of the patient's vital signs, his  temperature went up to 102.9 at 4:00 p.m. on the 16th.  It had been  101.7 around 1:00 p.m. on the same date.  NEURO:  The patient is alert, oriented, moving all extremities x4.  No  focal deficits.  HEENT:  Head normocephalic, sclera nonicteric.  CHEST:  Bilateral lung sounds are clear to auscultation on room air.  CARDIAC:  S1-S2, no rubs, murmurs, thrills or gallops.  Pulses regular.  He is on IV fluids.  He is mildly tachycardic at times.  ABDOMEN:  Abdomen is obese, soft, mildly diffusely tender without  guarding or rebounding.  No obvious hepatosplenomegaly, no abdominal  wall hernias.  EXTREMITIES:  Symmetrical in appearance without edema,  cyanosis or clubbing.   LABORATORY DATA:  Sodium 138, potassium 4.2, CO2 27, glucose 123, BUN 6,  creatinine 0.88, total bilirubin 1.0, alkaline phosphatase 52, ALT 21,  AST 14.  Amylase and lipase as previously mentioned from the 15th and  16th.  White count is 13,400, hemoglobin 12.9, platelets 149,000.   Diagnostic CT of the abdomen and pelvis as well as ultrasound as  previously mentioned.  See dictation in E-chart.   IMPRESSION:  1. Abdominal pain, secondary to acute pancreatitis, possible biliary      etiology.  2. Gallbladder sludge on ultrasound with mildly dilated  common bile      duct.  3. History of microscopic colitis and persistent diarrhea.   PLAN:  1. Discussed with the patient that based on clinical findings that      gallbladder may be the etiology to his pancreatitis and that he may      benefit from having a cholecystectomy.  At this time, the patient      is reluctant to proceed with cholecystectomy  because he feels it      may worsen his baseline diarrhea.  He understands the risk and      potential complications of gall bladder surgery.  These risks      include, but are not limited to, bleeding, infection, common bile      duct injury, open surgery and the possiblity that his pancreatitis      is unrelated to his gall bladder.   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  He is concerned that a cholecystectomy will  exacerbate the diarrhea that is associated with his microscopic colitis.  He also understands there is a risk of recurrent pancreatitis if his  gall bladder is the source biliary tract debri.  A middle ground may be  to try Actigal.  I have also discussed this plan with Dr. Durenda Guthrie L. Rennis Harding, N.P.      Sandria Bales. Ezzard Standing, M.D.  Electronically Signed    ALE/MEDQ  D:  05/25/2007  T:  05/26/2007  Job:  [id number]   cc:   Mila Merry [facility]  Rachael Fee, MD

## 2011-03-23 NOTE — Discharge Summary (Signed)
NAMEVARUN, JOURDAN               ACCOUNT NO.:  [id number]   MEDICAL RECORD NO.:  [id number]          PATIENT TYPE:  INP   LOCATION:  1521                         FACILITY:  [facility]   PHYSICIAN:  Rosalyn Gess. Norins, MD  DATE OF BIRTH:  1966/10/12   DATE OF ADMISSION:  05/16/2008  DATE OF DISCHARGE:  05/23/2008                               DISCHARGE SUMMARY   CONSULTANTS:  1. Dr. Wenda Low for General Surgery.  2. Dr. Rob Bunting for Gastroenterology.   HISTORY OF PRESENT ILLNESS:  Mr. 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  He has had a prior history of pancreatitis.  The  patient presented on the day of admission with a complaint of abdominal  pain that he had noted on May 10, 2008 with increased stool frequency  with 4 to 5 loose stools a day.  He then developed mild gastric  discomfort on July 5, which became progressively worse.  On the day of  admission, he described pain as constant, in the left upper quadrant in  epigastric region, accompanied by nausea and dry heaves but no true  emesis.  In his work he is exposed to people who might have possible  influenza although he did not appear to have influenza at time of  admission.  The patient was thought to have recurrent pancreatitis and  was admitted to hospital for the same.   PAST MEDICAL HISTORY:  1. Nephrolithiasis.  2. History of acute pancreatitis thought to be secondary to biliary      sludging.  3. Microscopic colitis.  Biopsy negative in the past.  4. History of vertigo labyrinthitis.  5. Diarrhea for 2 to 3 days as noted.   PAST SURGICAL HISTORY:  1. Includes tonsillectomy.  2. Wisdom tooth extraction.   SOCIAL HISTORY:  The patient is married and works as a Transport planner.  Has  occasional EtOH use.  No tobacco use.   FAMILY HISTORY:  The mother with Wegener's hypertension, COPD, aortic  stenosis.  Father with BPH and   hypertension.   CURRENT MEDICATIONS:  No regular medications.  He has been taking  Lomotil or Imodium p.r.n.   REVIEW OF SYSTEMS:  Positive for sweats, question of fever.  No ENT,  Cardiovascular, respiratory problems GI: As above.   PHYSICAL EXAMINATION:  At admission this is a male who was awake and  alert but uncomfortable.  ENT exam was unremarkable.  NECK:  Supple without thyromegaly.  CARDIOVASCULAR:  S1,S2 with a regular rate and rhythm without murmurs.  The patient did have some trace lower extremity edema at the ankles.  CHEST:  With good breath sounds, clear to auscultation and percussion  although some diminished breath sounds at the bases.  No increased work  of breathing.  SKIN:  Was clear  PSYCH:  Normal.  NEURO:  Normal.  GI: The patient had mildly distended abdomen with tenderness in the  epigastrium in the left upper quadrant.  The patient was normotensive.  MUSCULOSKELETAL:  Exam was  negative.   ADMISSION LABORATORY:  Hemoglobin was 14.8 grams, white count was  11,700, platelet count 173,000.  Comprehensive metabolic panel with a  sodium 161, potassium 4.3, chloride 104, CO2 of 27, BUN of 18,  creatinine 0.97, glucose was 98, SGOT normal.  SGPT was normal.  Serum  amylase was elevated at 581.  Serum lipase was elevated 896.  Urinalysis  was negative.  Hour and 98 day.  This dictation.  Before and did the  HPI.   HOSPITAL COURSE:  1. GI: Patient admitted with abdominal pain with elevated lipase and      amylase consistent with pancreatitis.  He did have a CT scan of the      abdomen July 10 with a final impression of findings consistent with      acute pancreatitis involving the tail of pancreas with no evidence      of necrosis, pseudocyst or abscess formation.  He had bilateral      subsegmental lower lobe atelectasis.  2. CT of pelvis was negative.  The patient was put on bowel rest..  He      was started on pain medication.Marland Kitchen  He failed morphine and was put  on      a Dilaudid PCA with push Dilaudid for breakthrough.  On this      regimen the patient had slow resolution of his symptoms.  He was      seen in consultation by general surgery who felt that he had no      surgical abdomen, and was not a candidate for surgery this      admission but would be a candidate for elective laparoscopic      cholecystectomy when he was out from his pancreatitis for some      time.  GI felt the patient did not have any additional needs for      further studies at this time.  Recommend continued bowel rest.  On      this regimen the patient's amylase and lipase returned to normal.      His abdominal pain improved.  He was able to take a diet which was      advanced slowly.  He had continued pain and was continued on      Dilaudid.  This became a p.r.n. order.  The patient was able      tolerate a full liquid diet and was advanced.  He continued do well      with resolution of his pancreatitis.  3. Pleuritic chest pain.  The patient did have some onset of pleuritic      chest pain.  He had atelectasis at admission and on follow-up      evaluation with CT to rule out PE.  Showed no pulmonary embolus but      showed that he had collapse of the left lower lobe as well as      effusions.  It was felt he might also have an underlying infiltrate      in th e left lower lobe.  The patient had been switched to p.o.      Ceftin, but was restarted on IV Rocephin.  The patient's white      count at admission had been 11,700.  Peak was 15,410 and slowly      improved so at that time of discharge, the white count was normal      at 10,400.  The patient was seen in consultation by the  pulmonary      service.  He did have bilateral decubitus chest x-rays, which      showed free flowing layering effusions left greater than right.  On      that study was read out as having a very small loculated      pneumothorax at the costophrenic angle.  It was felt that his left       lower lobe collapse was secondary to effusion secondary to      pancreatitis.  It was not felt that any intervention was required.      Dr. Sherene Sires recommended converting to oral medication, to continue      incentive spirometry, to continue p.o. antibiotics.  He felt that      Pulmonary could see him on a p.r.n. basis.  He did recommend chest      x-ray in 1 week following discharge.  The patient had had some      problems with low O2 saturations when supine, improved.  Last      oxygen saturation was 95% on room air.  With the patient's pancreatitis having resolved, with left lower lobe  atelectasis being fully evaluated with no need for further intervention,  the patient was thought to be stable and ready for discharge home.   DISCHARGE EXAMINATION:  Temperature was 98.2, blood pressure 133/87,  pulse 82, respirations 18, O2 saturation was 95% on room air.  GENERAL APPEARANCE:  This is an obese Caucasian gentleman in no acute  distress.  HEENT: The patient is noted to have erythema of his tongue consistent  with possible antibiotic related thrush.  CHEST:  Patient is moving air well with decreased breath sounds in the  left base.  There was no rub.  There is no wheezing.  CARDIOVASCULAR: The patient had a quiet precordium with a regular rate  and rhythm.  ABDOMEN:  Obese, positive bowel sounds were noted.  There is no  tenderness to palpation in the epigastric region.  No further  examination conducted.   FINAL LABORATORY:  Sedimentation rate was 90.  Final chemistries May 23, 2008 with sodium 142, potassium 3.1, chloride 106, CO2 of 28, BUN of  5, creatinine 0.99, glucose 108.  LFTs were normal.  Hemoglobin 11.2  grams, white count 10,400.  Final radiology studies as noted.   DISCHARGE MEDICATIONS:  1. Ceftin 250 mg b.i.d. for five additional days.  2. Vicodin 5/500 to take every 6 hours as needed for pain.  3. The patient to take over-the-counter Aleve 1 or 2 tablets b.i.d. as       needed for diffuse discomfort and pain.  4. Patient to use Nystatin suspension for thrush with swish and spit      p.r.n.Marland Kitchen  Patient will need oral potassium replacement for 5-7 days      with 20 mEq of potassium daily.   DISPOSITION:  The patient is discharged home.  He is a Building services engineer. and his wife  is a  Building services engineer., so has adequate medical supervision at home.  He is to  contact and schedule appointment with Everrett Coombe, nurse practitioner,  for follow-up at the [facility] office in 1 week.  At that time he will  need to have a follow-up PA and lateral chest x-ray and follow-up  metabolic panel.   The patient's condition at time of discharge dictation is stable and  improved.      Rosalyn Gess Norins, MD  Electronically Signed     MEN/MEDQ  D:  05/23/2008  T:  05/23/2008  Job:  [id number]   cc:   Billie D. Jillyn Hidden, FNP  [address] [place], [place] [address]   Sandria Bales. Ezzard Standing, M.D.  [address]  [place]  [place] [address]   Charlaine Dalton. Sherene Sires, MD, FCCP  [address]  [place] [address]   Rachael Fee, MD  [address]  [place], [place] [address]

## 2011-03-26 NOTE — Discharge Summary (Signed)
NAMEJOSPEH, MANGEL NO.:  [id number]   MEDICAL RECORD NO.:  [id number]          PATIENT TYPE:  INP   LOCATION:  5501                         FACILITY:  [facility]   PHYSICIAN:  Rene Paci, M.D. LHCDATE OF BIRTH:  02-Oct-1966   DATE OF ADMISSION:  08/09/2005  DATE OF DISCHARGE:  08/11/2005                                 DISCHARGE SUMMARY   DIAGNOSIS AT DISCHARGE:  Vertigo, probable acute  labyrinthitis/vestibulopathy.   PAST MEDICAL HISTORY:  1.  Cholangitis colitis, remote.  2.  History of kidney stones.  3.  History of admission several years back for acute gastritis.   PAST SURGICAL HISTORY:  1.  Tonsillectomy.  2.  Wisdom tooth extraction.   HISTORY OF PRESENT ILLNESS:  The patient is a 45 year old male who developed  dizziness approximately 72 hours prior to admission, which was accompanied  by nausea.  The patient reports positive vomiting and sensation of spinning  of the room for the last 24 hours.  The patient denied syncope or  presyncopal symptoms.  The patient was admitted for further evaluation.   HOSPITAL COURSE:  Vertigo.  Probably acute labyrinthitis.  The patient was  admitted and was placed on Phenergan, Valium, and meclizine.  The patient  had little improvement in symptoms.  A neurologic evaluation was obtained.  The patient was seen by Dr. Gustavus Messing. [facility], who believed that the  patient's vertigo was likely secondary to acute labyrinthitis/vestibulopathy  or benign positional vertigo.  An MRI/MRA was performed which was negative,  with the exception of minimal maxillary sinus thickening.  A CT of the head  was performed which was negative, as well as a KUB which was negative for  any acute abdominal issues.  The patient is currently tolerating a regular  diet.  He is agreeable to outpatient vestibular rehabilitation.  I have  faxed over the prescription, as well as patient information as requested to  the outpatient  rehabilitation.  They will contact the patient with an  appointment.   LABORATORIES AT DISCHARGE:  Hemoglobin 15.3, hematocrit 44.5, BUN 12,  creatinine 1.2.   MEDICATIONS AT DISCHARGE:  1.  Valium 5 mg one-half to one tablet three times daily as needed.  2.  Phenergan 25 mg p.o. q.6h. p.r.n.  3.  Scopolamine patch 1.5 mg applied behind the ear every 3 days as needed.  4.  Meclizine 25 mg every 4 hours as needed.  5.  Flonase one spray each nostril daily.   Prescriptions were provided for the above medications.   FOLLOW UP:  1.  The patient does not have a primary care Isacc Turney.  He does wish to      establish care at the [facility]      office.  A follow up appointment has been scheduled with Marlise Eves,      nurse practitioner, at [facility] on Wednesday, August 18, 2005 at      11:30 a.m.  2.  In addition, the patient has been referred for outpatient vestibular      rehabilitation.      Melissa  S. Peggyann Juba, NP      Rene Paci, M.D. [facility]  Electronically Signed    MSO/MEDQ  D:  08/11/2005  T:  08/12/2005  Job:  [id number]   cc:   Marlise Eves, N.P.  [facility]

## 2011-03-26 NOTE — Assessment & Plan Note (Signed)
Caleb [name]                         GASTROENTEROLOGY OFFICE NOTE   Caleb [name]                      MRN:          [id number]  DATE:12/20/2006                            DOB:          [birth year]    REASON FOR REFERRAL:  Caleb [name] asked me to evaluate Caleb [name] in  consultation regarding chronic diarrhea.   HISTORY OF PRESENT ILLNESS:  Caleb [name] is a very pleasant 45 year old  man who began having chronic daily loose stools, non-bloody, back in  2002 or so.  He eventually was worked up at [facility] with lab tests and a  colonoscopy.  He brought in a very good report of all of this.  Although, the official report from [facility] for the colonoscopy and the path  report is not yet here.  Lab testing shows he was TTG negative.  Thyroid  was normal.  VIP was normal, 5HIAA testing was normal.  He had a  colonoscopy done that was normal, although biopsies suggested some  collagenous colitis.  He tried Pepto-Bismol without much help.  He  eventually switched to Entocort.  This did not seem to help him, but  shortly after starting his Anticort, he was treated H. pylori.  This was  complicated by C. difficile, so I am not sure that it was the best  trial.   He still has 3 to 5 loose stools on a daily basis.  This can cause  problems, especially when he goes flying, which he likes to do.  He will  take Imodium on a p.r.n. basis.  This does seem to help him when he  really needs to not go.  He has very rare nocturnal symptoms, and no  bleeding, no cramping.   REVIEW OF SYSTEMS:  Notable for a 20-pound weight gain in the past 3 to  4 years.  Otherwise, is essentially normal, and is available on his  nursing intake sheet.   PAST MEDICAL HISTORY:  1. Kidney stones.  2. Allergy trouble.  3. Collagenous colitis as above.   CURRENT MEDICATIONS:  None.   ALLERGIES:  PENICILLIN.   SOCIAL HISTORY:  Married with 1 son.  Works as a Transport planner.  Nonsmoker.  Drinks  1  to 2 drinks per month.  Rarely drinks caffeine.   FAMILY HISTORY:  No colon cancer or colon polyps in family.   PHYSICAL EXAMINATION:  Five feet 11 inches, 242 pounds.  Blood pressure  110/76.  Pulse 60.  CONSTITUTIONAL:  Generally well appearing.  NEUROLOGIC:  Alert and oriented x3.  Eyes:  Extraocular movements intact.  Mouth:  Oropharynx moist.  No  lesions.  NECK:  Supple.  No lymphadenopathy.  CARDIOVASCULAR:  Heart regular rate and rhythm.  LUNGS:  Clear to auscultation bilaterally.  ABDOMEN:  Soft, non-tender, non-distended.  Normal bowel sounds.  EXTREMITIES:  No lower extremity edema.  SKIN:  No rashes or lesions on visible extremities.   ASSESSMENT AND PLAN:  A 45 year old man with chronic non-bloody loose  stools diagnosed as collagenous colitis at [facility].   First, we will get the records sent over from [facility].  Most importantly,  will be colonoscopy and the path report.  We will also get a basic set  of labs including a CBC, complete metabolic profile and thyroid testing  to see if he has any electrolyte dysfunction from these chronic loose  stools, or is anemic, although he does not appear to be clinically that  on exam today.  I have had success with Entocort 9 mg daily for  microscopic colitis such as his.  He did try this in 2004, but this was  during the setting of antibiotics for H. pylori complicated by C.  difficile, and I am not sure that would be the fairest way to judge  whether Entocort really does indeed help his problem or not.  He will,  therefore, start 9 mg of Entocort on once daily basis.  He will return  to see me in 1 month's time.  If he notices no difference after 1 month,  I think that will be a long enough trial.  If that is indeed the case,  there are other options.  It does seem like Imodium helps him when he  takes it, and if that is what we need to do is keep him on Imodium on a  chronic scheduled basis rather than p.r.n., that would also  be  reasonable.     Caleb Fee, MD  Electronically Signed    DPJ/MedQ  DD: 12/20/2006  DT: 12/20/2006  Job #: [id number]   cc:   Caleb Sheng D. Bean, FNP

## 2011-03-26 NOTE — Consult Note (Signed)
Caleb [name], LANTIGUA NO.:  [id number]   MEDICAL RECORD NO.:  [id number]          PATIENT TYPE:  INP   LOCATION:  5501                         FACILITY:  [facility]   PHYSICIAN:  Gustavus Messing. Orlin Hilding, M.D.DATE OF BIRTH:  09/26/1966   DATE OF CONSULTATION:  08/10/2005  DATE OF DISCHARGE:                                   CONSULTATION   CHIEF COMPLAINT:  Vertigo, nausea, vomiting.   HISTORY OF PRESENT ILLNESS:  Caleb [name] is a 45 year old, right-handed,  white male who was generally in good health.  He felt mildly dizzy Saturday  night coming home from work in [place].  Sunday morning he woke with  vertigo with illusion of motion of his environment with any position change.  It would subside a little bit if he held perfectly still.  He tried some  over the counter meclizine and some leftover Phenergan from an episode of  kidney stones that he had some time ago.  However, by Sunday afternoon he  began to have vomiting with the ventral dry heaves.  He just stayed in bed.  He describes illusion of motion of the environment, mild oscillopsia and  ataxia when attempting to walk.   REVIEW OF SYSTEMS:  Negative for any double vision, dysarthria, dysphagia,  numbness, or weakness of his face or extremities or the cranial neuropathy.  He has no history of INO or ophthalmoplegia type symptoms or optic neuritis  type symptoms.  No history of transverse myelitis type symptoms or other MS  symptoms.   PAST MEDICAL HISTORY:  1.  Significant for collagenous colitis treated with steroids and p.r.n.      amitriptyline.  2.  Will note history of kidney stones and ongoing environmental allergies.   MEDICATIONS:  1.  Amitriptyline p.r.n.  2.  Flonase as needed.  3.  In the hospital he has also been taking Valium.  4.  Phenergan.  5.  Meclizine.  6.  Zofran.  7.  Tylenol.   ALLERGIES:  PENICILLIN.   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  No cigarette use, rare alcohol use.   FAMILY HISTORY:  Family history if negative for MS or other neurologic  abnormalities.   PHYSICAL EXAMINATION:  VITAL SIGNS:  Temperature is 97.2, pulse 61,  respirations 15, blood pressure 105/56, 90% saturation on room air.  HEENT:  Head is normocephalic, atraumatic.  NECK:  Supple without bruits.  HEART:  Regular rate and rhythm.  NEUROLOGIC:  He is awake, alert, and fully oriented with normal language and  cognition.  Pupils are equal and reactive.  Visual fields are full.  Extraocular movements are intact.  He does develop nystagmus with end gaze,  left greater than right with some oscillopsia.  He has just mild facial  sensation that is normal, facial motor activity is normal.  Hearing is  intact.  Palate is symmetric.  Tongue is midline.  There is no dysarthria.  Motor exam:  There is no drift in any of his four extremities.  He has  normal  bulk, tone, and strength throughout, normal gait.  No fasciculations,  atrophy, or tremor.  Deep tendon reflexes are 1+ with downgoing toes.  Coordination:  Finger-to-nose, heel-to-shin are normal.  Sensory is normal.  I did not have him try tandem gait.  CT of the head is normal.   IMPRESSION:  Vertigo, likely acute labyrinthitis or vesiculitis, or possibly  benign positional vertigo.  There is nothing to suggest a neurologic event  such as MS or stroke or VBI.   RECOMMENDATIONS:  Check MRI scan of the brain and MR angiogram to rule out  vertebral basilar insufficiency, MS, or stroke.  Treat him symptomatically  as you are with Zofran, Phenergan, Meclizine, low dose benzodiazepines.      Catherine A. Orlin Hilding, M.D.  Electronically Signed     CAW/MEDQ  D:  08/10/2005  T:  08/10/2005  Job:  [id number]

## 2011-03-26 NOTE — Assessment & Plan Note (Signed)
[facility]                         GASTROENTEROLOGY OFFICE NOTE   NICOLAOS, MITRANO                      MRN:          [id number]  DATE:02/01/2007                            DOB:          1966/08/12    GI PROBLEM LIST:  1. 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  Extensive workup      at [facility] in 2000 and 2004:  TTG negative, thyroid testing normal,      VIP normal, 5-HIAA testing normal, colonoscopy performed March 2004      normal endoscopically, but biopsies suggested collagenous colitis.   INTERVAL HISTORY:  I had last saw Mr. Enke about 6 weeks ago.  Since  then, he began taking Entocort 9 mg on a daily basis.  He has noticed  that his stools have firmed up, but he is still going 3 to 4 times a  day, and this does get in the way of his work schedule.  Sometimes he  will have to move his bowels while on the road.  He does not drink  caffeine, chew hard candy, sugarless gum, or sports drinks.   CURRENT MEDICATIONS:  Entocort 9 mg daily.   PHYSICAL EXAMINATION:  Weight is 241 pounds.  Blood pressure 114/90.  Pulse 88.  LUNGS:  Clear to auscultation bilaterally.  HEART:  Regular rate and rhythm.  ABDOMEN:  Soft, non-tender, non-distended, normal bowel sounds.  EXTREMITIES:  No lower extremity edema.   ASSESSMENT AND PLAN:  A 45 year old man with collagenous colitis.   Entocort at 9 mg a day seems to have firmed up his stools a bit, but he  is still going fairly frequently, and this is getting in the way of his  work schedule.  He will add Imodium 2 pills shortly after he wakes up,  as well as 1 to 2 pills later on in the afternoon.  He will return to  see me in 4 weeks' time, and sooner if needed.  He brought up the  possibility of __________ bacterial overgrowth that is complicating  this.  I think it seems unlikely given that he has no risk factors for  it, but if Imodium does not help his situation, we will consider a short  course of oral antibiotics.     Rachael Fee, MD  Electronically Signed    DPJ/MedQ  DD: 02/01/2007  DT: 02/01/2007  Job #: [id number]   cc:   Billie D. Bean, FNP

## 2011-03-26 NOTE — Assessment & Plan Note (Signed)
[facility]                         GASTROENTEROLOGY OFFICE NOTE   [name], [name]                      MRN:          [id number]  DATE:02/21/2007                            DOB:          04/12/1966    GI PROBLEM LIST:  1. 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  Extensive workup      at [facility] in 2000 and 2004:  TTG negative, thyroid testing normal,      VIP normal, 5-HIAA testing normal, colonoscopy performed March 2004      normal endoscopically, but biopsies suggested collagenous colitis.      Continuing Anticort 3 pills daily.   INTERVAL HISTORY:  I last saw Caleb [name] 2-1/2 to 3 weeks ago.  He came  back prior to his regularly scheduled due to new problems that began  last week.  He had rather acute onset of abdominal pain and nausea.  The  pain was in his mid abdomen.  He waited a day or so to see if it would  get better, and it did not, so he presented to his primary care  physician, who did lab tests and imaging studies.  Labs show a white  count of 11.4; it was otherwise normal.  His complete metabolic profile  was normal.  Lipase was also normal.  CT scan done was read as showing  some small nodes in the jejunal mesentery with subtly increased  mesenteric fat density.  Findings are suspicious for mesenteric  adenitis.  Also, subtle ill definition of the pancreatic head, possibly  related to the inflammatory process in the root of the small bowel  mesentery.   Since then, he says he is probably 80% to 90% better.  He still has some  mild nausea.  He has not required any pain medicines.  The pain is  almost gone.  He still has very mild discomfort in his abdomen.  He did  have 2 days of very watery stools about 2 to 3 days after the pain  onset.   PHYSICAL EXAMINATION:  Weight is 237 pounds.  Blood pressure 120/90.  Pulse 80.  CONSTITUTIONAL:  Generally well appearing.  NEUROLOGIC:  Alert and oriented x3.  ABDOMEN:  Soft.   Mildly tender in midepigastrium.  No peritoneal signs.  Normal bowel sounds.   ASSESSMENT AND PLAN:  A 45 year old man with recent acute abdominal pain  and diarrhea.   The CT scan suggested some mesenteric adenitis.  It is not clear why he  would have this, although clinically he may have had a viral  gastroenteritis.  There is certainly something going on in the community  right now.  It is not usual for the pain to precede the diarrhea by 2 to  3 days like his did, but he was taking Imodium and Anticort for his  underlying microscopic colitis.  Perhaps that was holding back the flow  of diarrhea that he would have had otherwise.  Right now he feels very  well.  His lab tests were fairly normal except for a slight white count,  and I  think simply observing  him for the next 3 to 4 weeks is reasonable.  He  will return to see me in 4 weeks, and he will get a repeat CT scan of  his abdominal and pelvis just prior to that visit.     Rachael Fee, MD  Electronically Signed    DPJ/MedQ  DD: 02/21/2007  DT: 02/21/2007  Job #: [id number]   cc:   Billie D. Bean, FNP

## 2011-07-12 ENCOUNTER — Other Ambulatory Visit: Payer: Self-pay | Admitting: Family Medicine

## 2011-07-12 DIAGNOSIS — Z8249 Family history of ischemic heart disease and other diseases of the circulatory system: Secondary | ICD-10-CM

## 2011-07-12 DIAGNOSIS — Z131 Encounter for screening for diabetes mellitus: Secondary | ICD-10-CM

## 2011-07-30 ENCOUNTER — Encounter: Payer: Self-pay | Admitting: Family Medicine

## 2011-08-05 LAB — DIFFERENTIAL
Eosinophils Relative: 1
Lymphocytes Relative: 5 — ABNORMAL LOW
Lymphs Abs: 0.7
Monocytes Absolute: 1.3 — ABNORMAL HIGH
Neutro Abs: 13.3 — ABNORMAL HIGH

## 2011-08-05 LAB — BASIC METABOLIC PANEL
BUN: 13
Calcium: 8.4
Calcium: 8.6
Creatinine, Ser: 1.21
GFR calc Af Amer: >60
GFR calc Af Amer: >60
GFR calc non Af Amer: >60
GFR calc non Af Amer: >60
GFR calc non Af Amer: >60
Glucose, Bld: 89
Glucose, Bld: 96
Potassium: 3 — ABNORMAL LOW
Sodium: 140
Sodium: 142

## 2011-08-05 LAB — CBC
HCT: 33.6 — ABNORMAL LOW
HCT: 36.8 — ABNORMAL LOW
Hemoglobin: 11.4 — ABNORMAL LOW
Hemoglobin: 12.4 — ABNORMAL LOW
Hemoglobin: 13.2
Hemoglobin: 14.8
Platelets: 149 — ABNORMAL LOW
RBC: 3.81 — ABNORMAL LOW
RBC: 4.25
RBC: 4.88
RDW: 13.5
RDW: 13.6
RDW: 13.8
WBC: 15.4 — ABNORMAL HIGH
WBC: 16.3 — ABNORMAL HIGH

## 2011-08-05 LAB — COMPREHENSIVE METABOLIC PANEL
ALT: 15
AST: 19
Alkaline Phosphatase: 55
Glucose, Bld: 98
Potassium: 4.3
Sodium: 137
Total Protein: 7.3

## 2011-08-05 LAB — URINALYSIS, ROUTINE W REFLEX MICROSCOPIC
Bilirubin Urine: NEGATIVE
Glucose, UA: NEGATIVE
Hgb urine dipstick: NEGATIVE
Specific Gravity, Urine: 1.005
pH: 6

## 2011-08-05 LAB — LIPASE, BLOOD
Lipase: 25
Lipase: 292 — ABNORMAL HIGH
Lipase: 71 — ABNORMAL HIGH
Lipase: 896 — ABNORMAL HIGH

## 2011-08-05 LAB — AMYLASE
Amylase: 229 — ABNORMAL HIGH
Amylase: 581 — ABNORMAL HIGH

## 2011-08-06 ENCOUNTER — Other Ambulatory Visit (INDEPENDENT_AMBULATORY_CARE_PROVIDER_SITE_OTHER): Payer: BC Managed Care – PPO

## 2011-08-06 DIAGNOSIS — Z8249 Family history of ischemic heart disease and other diseases of the circulatory system: Secondary | ICD-10-CM

## 2011-08-06 DIAGNOSIS — Z131 Encounter for screening for diabetes mellitus: Secondary | ICD-10-CM

## 2011-08-06 LAB — COMPREHENSIVE METABOLIC PANEL
ALT: 34
AST: 26
Alkaline Phosphatase: 61
CO2: 28
Calcium: 8.2 — ABNORMAL LOW
GFR calc Af Amer: >60
GFR calc non Af Amer: >60
Glucose, Bld: 108 — ABNORMAL HIGH
Potassium: 3.1 — ABNORMAL LOW
Sodium: 142

## 2011-08-06 LAB — LIPID PANEL
Cholesterol: 178 mg/dL (ref 0–200)
HDL: 42.1 mg/dL (ref >39.00)
LDL Cholesterol: 108 mg/dL — ABNORMAL HIGH (ref 0–99)
Total CHOL/HDL Ratio: 4
Triglycerides: 139 mg/dL (ref 0.0–149.0)
VLDL: 27.8 mg/dL (ref 0.0–40.0)

## 2011-08-06 LAB — CBC
Hemoglobin: 11.2 — ABNORMAL LOW
MCHC: 34.1
MCHC: 34.2
MCV: 87.1
Platelets: 172
RBC: 3.76 — ABNORMAL LOW
RBC: 3.79 — ABNORMAL LOW
RDW: 14.1
WBC: 10.4

## 2011-08-06 LAB — DIFFERENTIAL
Basophils Absolute: 0.1
Basophils Relative: 1
Eosinophils Absolute: 0.3
Neutro Abs: 6.6
Neutrophils Relative %: 74

## 2011-08-06 LAB — SEDIMENTATION RATE: Sed Rate: 90 — ABNORMAL HIGH

## 2011-08-06 LAB — CLOSTRIDIUM DIFFICILE EIA: C difficile Toxins A+B, EIA: NEGATIVE

## 2011-08-12 ENCOUNTER — Other Ambulatory Visit: Payer: Self-pay | Admitting: *Deleted

## 2011-08-12 ENCOUNTER — Ambulatory Visit (INDEPENDENT_AMBULATORY_CARE_PROVIDER_SITE_OTHER): Payer: BC Managed Care – PPO | Admitting: Family Medicine

## 2011-08-12 ENCOUNTER — Encounter: Payer: Self-pay | Admitting: Family Medicine

## 2011-08-12 DIAGNOSIS — Z9109 Other allergy status, other than to drugs and biological substances: Secondary | ICD-10-CM

## 2011-08-12 DIAGNOSIS — G4700 Insomnia, unspecified: Secondary | ICD-10-CM | POA: Insufficient documentation

## 2011-08-12 DIAGNOSIS — Z0000 Encounter for general adult medical examination without abnormal findings: Secondary | ICD-10-CM | POA: Insufficient documentation

## 2011-08-12 DIAGNOSIS — Z889 Allergy status to unspecified drugs, medicaments and biological substances status: Secondary | ICD-10-CM

## 2011-08-12 DIAGNOSIS — M62838 Other muscle spasm: Secondary | ICD-10-CM | POA: Insufficient documentation

## 2011-08-12 MED ORDER — TRIAMCINOLONE ACETONIDE 0.1 % EX CREA: TOPICAL_CREAM | Freq: Two times a day (BID) | CUTANEOUS | Status: DC

## 2011-08-12 MED ORDER — CYCLOBENZAPRINE HCL 10 MG PO TABS: 5.0000 mg | ORAL_TABLET | Freq: Three times a day (TID) | ORAL | Status: DC | PRN

## 2011-08-12 MED ORDER — TRAZODONE HCL 50 MG PO TABS: 25.0000 mg | ORAL_TABLET | Freq: Every day | ORAL | Status: DC

## 2011-08-12 NOTE — Progress Notes (Signed)
CPE- See plan.  Routine anticipatory guidance given to patient.  See health maintenance.  Insomnia.  Shift work and sleep disruption.  He tried unisom, benadryl but felt after effects with both.  No help with melatonin.  Tries to have good sleep hygiene.    Muscle spasms in L lower back.  Had stretched and massage.  Skelaxin didn't help.  Asking about options.  This may affect sleep.    Rash- atopic rash on shins in winter.  Uses TAC cream with relief.    PMH and SH reviewed  Meds, vitals, and allergies reviewed.   ROS: See HPI.  Otherwise negative.    GEN: nad, alert and oriented HEENT: mucous membranes moist NECK: supple w/o LA CV: rrr. PULM: ctab, no inc wob ABD: soft, +bs, no HSM EXT: no edema SKIN: no acute rash Back with minimal L L-spine paraspinal muscle tenderness

## 2011-08-12 NOTE — Assessment & Plan Note (Signed)
Continue stretching with flexeril prn.  Call back as needed.

## 2011-08-12 NOTE — Patient Instructions (Addendum)
Try the trazodone and let me know if that doesn't help.  Use the flexeril for spasms and keep stretching.  Take care.  Keep exercising.  Glad to see you.

## 2011-08-12 NOTE — Telephone Encounter (Signed)
Sent!

## 2011-08-12 NOTE — Assessment & Plan Note (Signed)
Had tdap at [facility], will get flu shot at work.  D/w pt about exercise.  Healthy habits encouraged. Not due for prostate/colon CA screen.  Labs d/w pt.

## 2011-08-12 NOTE — Assessment & Plan Note (Signed)
No rash now but anticipated in the winter.  Will rx TAC and use prn.  He agrees, f/u prn.

## 2011-08-12 NOTE — Assessment & Plan Note (Signed)
He tries to have good sleep hygiene.  Works swing shift.  Try trazodone and consider ambien if no relief.  He agrees. Call back as needed.

## 2011-08-12 NOTE — Telephone Encounter (Signed)
This med is available in an 80 gram tube and patient is requesting that we change to the larger size.  Please send new Rx if this is okay.

## 2011-08-23 LAB — COMPREHENSIVE METABOLIC PANEL
ALT: 14
AST: 14
AST: 17
Albumin: 2.8 — ABNORMAL LOW
Alkaline Phosphatase: 48
Alkaline Phosphatase: 52
BUN: 6
BUN: 8
CO2: 27
CO2: 28
Calcium: 8.4
Calcium: 8.7
Chloride: 104
Creatinine, Ser: 0.9
Creatinine, Ser: 1
GFR calc Af Amer: >60
GFR calc non Af Amer: >60
Glucose, Bld: 96
Potassium: 4.2
Potassium: 4.3
Sodium: 137
Total Bilirubin: 1
Total Protein: 6.3
Total Protein: 7.1

## 2011-08-23 LAB — CBC
HCT: 37.6 — ABNORMAL LOW
HCT: 40.2
Hemoglobin: 13.7
Hemoglobin: 14.8
MCHC: 34.2
MCV: 88
Platelets: 149 — ABNORMAL LOW
Platelets: 151
Platelets: 174
RBC: 4.27
RBC: 4.92
RDW: 13.3
RDW: 13.4
WBC: 10
WBC: 13.4 — ABNORMAL HIGH

## 2011-08-23 LAB — DIFFERENTIAL
Eosinophils Relative: 2
Lymphocytes Relative: 14
Lymphs Abs: 1.4
Monocytes Relative: 8
Neutrophils Relative %: 76

## 2011-08-23 LAB — BASIC METABOLIC PANEL
BUN: 5 — ABNORMAL LOW
Calcium: 8.5
GFR calc non Af Amer: >60
Glucose, Bld: 101 — ABNORMAL HIGH
Sodium: 140

## 2011-08-23 LAB — URINALYSIS, ROUTINE W REFLEX MICROSCOPIC
Glucose, UA: NEGATIVE
Nitrite: NEGATIVE
Specific Gravity, Urine: 1.02
pH: 7.5

## 2011-08-23 LAB — LIPID PANEL
Cholesterol: 173
HDL: 52
LDL Cholesterol: 98
Triglycerides: 117

## 2011-08-23 LAB — LIPASE, BLOOD
Lipase: 149 — ABNORMAL HIGH
Lipase: 58

## 2011-11-25 ENCOUNTER — Telehealth: Payer: Self-pay

## 2011-11-25 MED ORDER — ZOLPIDEM TARTRATE 10 MG PO TABS: 5.0000 mg | ORAL_TABLET | Freq: Every evening | ORAL | Status: DC | PRN

## 2011-11-25 NOTE — Telephone Encounter (Signed)
Agreed, please call in Caleb [name] rx.  Have him call back as needed.

## 2011-11-25 NOTE — Telephone Encounter (Signed)
Pt saw Dr Para March 10/12 for ck up. Pt said Tranzadone is not helping him sleep and is also causing dizziness. Pt request to d/c Tranzadone and change if possible to Ambien. Pt uses [facility] and can be called back at [phone]. Pt does not expect call back today.

## 2011-11-26 NOTE — Telephone Encounter (Signed)
Medication phoned to pharmacy.  Pt advised. 

## 2012-08-21 ENCOUNTER — Other Ambulatory Visit: Payer: BC Managed Care – PPO

## 2012-08-25 ENCOUNTER — Ambulatory Visit (INDEPENDENT_AMBULATORY_CARE_PROVIDER_SITE_OTHER): Payer: BC Managed Care – PPO | Admitting: Family Medicine

## 2012-08-25 ENCOUNTER — Encounter: Payer: Self-pay | Admitting: Family Medicine

## 2012-08-25 VITALS — BP 122/88 | HR 74 | Temp 98.3°F | Ht 71.0 in | Wt 239.0 lb

## 2012-08-25 DIAGNOSIS — Z889 Allergy status to unspecified drugs, medicaments and biological substances status: Secondary | ICD-10-CM

## 2012-08-25 DIAGNOSIS — R5383 Other fatigue: Secondary | ICD-10-CM

## 2012-08-25 DIAGNOSIS — R5381 Other malaise: Secondary | ICD-10-CM

## 2012-08-25 DIAGNOSIS — Z0000 Encounter for general adult medical examination without abnormal findings: Secondary | ICD-10-CM

## 2012-08-25 DIAGNOSIS — Z9109 Other allergy status, other than to drugs and biological substances: Secondary | ICD-10-CM

## 2012-08-25 DIAGNOSIS — G4700 Insomnia, unspecified: Secondary | ICD-10-CM

## 2012-08-25 DIAGNOSIS — M62838 Other muscle spasm: Secondary | ICD-10-CM

## 2012-08-25 DIAGNOSIS — E291 Testicular hypofunction: Secondary | ICD-10-CM

## 2012-08-25 LAB — TSH: TSH: 1.4 u[IU]/mL (ref 0.35–5.50)

## 2012-08-25 LAB — GLUCOSE, RANDOM: Glucose, Bld: 82 mg/dL (ref 70–99)

## 2012-08-25 MED ORDER — TRIAMCINOLONE ACETONIDE 0.1 % EX CREA: TOPICAL_CREAM | Freq: Two times a day (BID) | CUTANEOUS | Status: AC

## 2012-08-25 MED ORDER — CYCLOBENZAPRINE HCL 10 MG PO TABS: 5.0000 mg | ORAL_TABLET | Freq: Three times a day (TID) | ORAL | Status: AC | PRN

## 2012-08-25 MED ORDER — ZOLPIDEM TARTRATE 10 MG PO TABS: 5.0000 mg | ORAL_TABLET | Freq: Every evening | ORAL | Status: DC | PRN

## 2012-08-25 NOTE — Patient Instructions (Addendum)
Go to the lab on the way out.  We'll contact you with your lab report. Don't change your meds.  Take care.

## 2012-08-26 DIAGNOSIS — E291 Testicular hypofunction: Secondary | ICD-10-CM | POA: Insufficient documentation

## 2012-08-26 DIAGNOSIS — R5383 Other fatigue: Secondary | ICD-10-CM | POA: Insufficient documentation

## 2012-08-26 NOTE — Assessment & Plan Note (Signed)
Refer to uro. See notes on labs.

## 2012-08-26 NOTE — Assessment & Plan Note (Signed)
Routine anticipatory guidance given to patient.  See health maintenance. Tetanus 2007 Flu yesterday at work Diet and exercise discussed Living will encouraged No need to check lipids this year. No indication for colon/prostate cancer screening, discussed.

## 2012-08-26 NOTE — Assessment & Plan Note (Signed)
Continue PRN TAC and f/u prn.

## 2012-08-26 NOTE — Progress Notes (Signed)
CPE- See plan.  Routine anticipatory guidance given to patient.  See health maintenance. Tetanus 2007 Flu yesterday at work Diet and exercise discussed Living will encouraged No need to check lipids this year. No indication for colon/prostate cancer screening, discussed.  H/o wintertime atopy, ,responds to TAC without ADE.  Will need refill.  Insomnia, exacerbated but changes in work routine.  Uses ambien w/o ADE.  Good effect.  Will need refill.  H/o back/muscle spasms that respond to flexeril, used w/o ADE and needs refill.  Fatigue.  No clear source. Noted even with off of work.  Sleeping well usually, see above. Dec in libido noted.    He has f/u with ortho pending about his L knee.    PMH and SH reviewed  Meds, vitals, and allergies reviewed.   ROS: See HPI.  Otherwise negative.    GEN: nad, alert and oriented HEENT: mucous membranes moist, tm wnl, nasal and OP exam wnl NECK: supple w/o LA, no TMG CV: rrr. PULM: ctab, no inc wob ABD: soft, +bs EXT: no edema SKIN: no acute rash

## 2012-08-26 NOTE — Assessment & Plan Note (Signed)
D/w pt.  See notes on labs.  

## 2012-08-26 NOTE — Assessment & Plan Note (Signed)
Can use prn flexeril. No ADE.  

## 2012-08-26 NOTE — Assessment & Plan Note (Signed)
Continue prn ambien, no ADE.  Doing well with med.

## 2012-11-07 ENCOUNTER — Emergency Department (INDEPENDENT_AMBULATORY_CARE_PROVIDER_SITE_OTHER)
Admission: EM | Admit: 2012-11-07 | Discharge: 2012-11-07 | Disposition: A | Discharge status: Home / Self Care | Payer: BC Managed Care – PPO | Source: Home / Self Care | Attending: Family Medicine | Admitting: Family Medicine

## 2012-11-07 ENCOUNTER — Encounter: Payer: Self-pay | Admitting: Emergency Medicine

## 2012-11-07 DIAGNOSIS — J069 Acute upper respiratory infection, unspecified: Secondary | ICD-10-CM

## 2012-11-07 MED ORDER — METHYLPREDNISOLONE ACETATE 80 MG/ML IJ SUSP
80.0000 mg | Freq: Once | INTRAMUSCULAR | Status: AC
  Administered 2012-11-07: 80 mg via INTRAMUSCULAR

## 2012-11-07 MED ORDER — AZITHROMYCIN 250 MG PO TABS: ORAL_TABLET | ORAL | Status: DC

## 2012-11-07 MED ORDER — HYDROCOD POLST-CHLORPHEN POLST 10-8 MG/5ML PO LQCR: 5.0000 mL | Freq: Every evening | ORAL | Status: DC | PRN

## 2012-11-07 NOTE — ED Notes (Signed)
Cough, wheezing, chills, headache, runny nose, body aches x 4 days

## 2012-11-07 NOTE — ED Provider Notes (Signed)
History     CSN: [id number]  Arrival date & time 11/07/12  1519   First MD Initiated Contact with Patient 11/07/12 1600      Chief Complaint  Patient presents with  . Influenza      HPI Comments: Patient complains of onset of flu-like symptoms four days ago including non-productive cough, wheezing, chills, headache, runny nose and body aches.  He had FluMist immunization this season.   The history is provided by the patient.    Past Medical History  Diagnosis Date  . Nephrolithiasis   . Pancreatitis     no clear source identified  . Collagenous colitis   . Insomnia     due to work cycle at [employer]  . Atopy     in wintertime on legs    Past Surgical History  Procedure Date  . Ct of abdomen 3/08    mild pancreatitis, inflamed  . Mandible surgery     Family History  Problem Relation Age of Onset  . Kidney disease Mother     from wegner's disease  . Hypertension Father   . Deep vein thrombosis Father   . Diabetes Father   . Prostate cancer Neg Hx   . Colon cancer Neg Hx     History  Substance Use Topics  . Smoking status: Never Smoker   . Smokeless tobacco: Not on file  . Alcohol Use: Yes     Comment: very rare      Review of Systems + sore throat, mild + cough No pleuritic pain ? wheezing + nasal congestion + post-nasal drainage No sinus pain/pressure No itchy/red eyes No earache No hemoptysis No SOB No fever, + chills No nausea No vomiting No abdominal pain No diarrhea No urinary symptoms No skin rashes + fatigue + myalgias + headache Used OTC meds without relief  Allergies  Penicillins  Home Medications   Current Outpatient Rx  Name  Route  Sig  Dispense  Refill  . AZITHROMYCIN 250 MG PO TABS      Take 2 tabs today; then begin one tab once daily for 4 more days. (Rx void after 11/18/11)   6 each   0   . HYDROCOD POLST-CPM POLST ER 10-8 MG/5ML PO LQCR   Oral   Take 5 mLs by mouth at bedtime as needed.   115 mL   0   .  CYCLOBENZAPRINE HCL 10 MG PO TABS   Oral   Take 0.5-1 tablets (5-10 mg total) by mouth every 8 (eight) hours as needed for muscle spasms (sedation caution).   30 tablet   12   . ONE-DAILY MULTI VITAMINS PO TABS   Oral   Take 1 tablet by mouth daily.           . TRIAMCINOLONE ACETONIDE 0.1 % EX CREA   Topical   Apply topically 2 (two) times daily. As needed.   45 g   12   . ZOLPIDEM TARTRATE 10 MG PO TABS   Oral   Take 0.5-1 tablets (5-10 mg total) by mouth at bedtime as needed.   30 tablet   5     BP 142/90  Pulse 107  Temp 98.3 F (36.8 C) (Oral)  Resp 18  Ht 5\' 11"  (1.803 m)  Wt 241 lb (109.317 kg)  BMI 33.61 kg/m2  SpO2 97%  Physical Exam Nursing notes and Vital Signs reviewed. Appearance:  Patient appears healthy, stated age, and in no acute distress Eyes:  Pupils  are equal, round, and reactive to light and accomodation.  Extraocular movement is intact.  Conjunctivae are not inflamed  Ears:  Canals normal.  Tympanic membranes normal.  Nose:  Mildly congested turbinates.  No sinus tenderness.  Pharynx:  Normal Neck:  Supple.  Slightly tender shotty posterior nodes are palpated bilaterally  Lungs:  Clear to auscultation.  Breath sounds are equal.  Heart:  Regular rate and rhythm without murmurs, rubs, or gallops.  Abdomen:  Nontender without masses or hepatosplenomegaly.  Bowel sounds are present.  No CVA or flank tenderness.  Extremities:  No edema.  No calf tenderness Skin:  No rash present.   ED Course  Procedures none   Labs Reviewed  POCT INFLUENZA A/B negative      1. Acute upper respiratory infections of unspecified site       MDM  DepoMedrol 80mg  IM.  Prescription written for Benzonatate [facility]) to take at bedtime for night-time cough.  Take plain Mucinex (guaifenesin) twice daily for cough and congestion.  May add Sudafed if needed.  Increase fluid intake, rest. May use Afrin nasal spray (or generic oxymetazoline) twice daily for about  5 days.  Also recommend using saline nasal spray several times daily and saline nasal irrigation (AYR is a common brand) Stop all antihistamines for now, and other non-prescription cough/cold preparations. May take Ibuprofen 200mg , 4 tabs every 8 hours with food for fever, body aches, etc. Begin Azithromycin if not improving about 5 days or if persistent fever develops (Given a prescription to hold, with an expiration date)  Follow-up with family doctor if not improving 7 to 10 days.         Lattie Haw, MD 11/07/12 [phone]

## 2013-09-13 ENCOUNTER — Other Ambulatory Visit: Payer: Self-pay

## 2013-09-14 ENCOUNTER — Other Ambulatory Visit: Payer: Self-pay | Admitting: Family Medicine

## 2013-09-14 DIAGNOSIS — Z131 Encounter for screening for diabetes mellitus: Secondary | ICD-10-CM

## 2013-09-14 DIAGNOSIS — Z13220 Encounter for screening for lipoid disorders: Secondary | ICD-10-CM

## 2013-10-08 ENCOUNTER — Other Ambulatory Visit (INDEPENDENT_AMBULATORY_CARE_PROVIDER_SITE_OTHER): Payer: BC Managed Care – PPO

## 2013-10-08 ENCOUNTER — Other Ambulatory Visit: Payer: BC Managed Care – PPO

## 2013-10-08 DIAGNOSIS — Z131 Encounter for screening for diabetes mellitus: Secondary | ICD-10-CM

## 2013-10-08 DIAGNOSIS — Z13220 Encounter for screening for lipoid disorders: Secondary | ICD-10-CM

## 2013-10-08 LAB — LDL CHOLESTEROL, DIRECT: Direct LDL: 139.3 mg/dL

## 2013-10-08 LAB — LIPID PANEL
HDL: 38.9 mg/dL — ABNORMAL LOW (ref >39.00)
VLDL: 33.8 mg/dL (ref 0.0–40.0)

## 2013-10-10 ENCOUNTER — Encounter: Payer: Self-pay | Admitting: Family Medicine

## 2013-10-10 ENCOUNTER — Ambulatory Visit (INDEPENDENT_AMBULATORY_CARE_PROVIDER_SITE_OTHER): Payer: BC Managed Care – PPO | Admitting: Family Medicine

## 2013-10-10 ENCOUNTER — Encounter: Payer: Self-pay | Admitting: Radiology

## 2013-10-10 VITALS — BP 118/84 | HR 91 | Temp 98.1°F | Ht 71.0 in | Wt 240.5 lb

## 2013-10-10 DIAGNOSIS — Z0000 Encounter for general adult medical examination without abnormal findings: Secondary | ICD-10-CM

## 2013-10-10 DIAGNOSIS — Z889 Allergy status to unspecified drugs, medicaments and biological substances status: Secondary | ICD-10-CM

## 2013-10-10 DIAGNOSIS — Z9109 Other allergy status, other than to drugs and biological substances: Secondary | ICD-10-CM

## 2013-10-10 DIAGNOSIS — G4700 Insomnia, unspecified: Secondary | ICD-10-CM

## 2013-10-10 DIAGNOSIS — M62838 Other muscle spasm: Secondary | ICD-10-CM

## 2013-10-10 DIAGNOSIS — Z8719 Personal history of other diseases of the digestive system: Secondary | ICD-10-CM

## 2013-10-10 DIAGNOSIS — K859 Acute pancreatitis without necrosis or infection, unspecified: Secondary | ICD-10-CM

## 2013-10-10 MED ORDER — CYCLOBENZAPRINE HCL 10 MG PO TABS: 10.0000 mg | ORAL_TABLET | Freq: Three times a day (TID) | ORAL | Status: DC | PRN

## 2013-10-10 MED ORDER — ZOLPIDEM TARTRATE 10 MG PO TABS: 5.0000 mg | ORAL_TABLET | Freq: Every evening | ORAL | Status: DC | PRN

## 2013-10-10 MED ORDER — TRIAMCINOLONE ACETONIDE 0.1 % EX CREA: 1.0000 "application " | TOPICAL_CREAM | Freq: Two times a day (BID) | CUTANEOUS | Status: DC | PRN

## 2013-10-10 NOTE — Patient Instructions (Signed)
Go to the lab on the way out.  We'll contact you with your lab report. Take care.  Glad to see you.  

## 2013-10-10 NOTE — Progress Notes (Signed)
Pre-visit discussion using our clinic review tool. No additional management support is needed unless otherwise documented below in the visit note.  CPE- See plan.  Routine anticipatory guidance given to patient.  See health maintenance. Tetanus 2007 Flu 2014 Shingles and PNA shot not indicated.   Colonoscopy not due now.  No symptoms. Likely with screening due at 52.  PSA not indicated.   Living will prev done.  Wife would be designated if he is incapacitated.   Diet and exercise d/w pt.  He usually does well except for recently on a cruise.  Encouraged.  Sugar wnl, lipids mildly increased.   Discussed.    Shift work insomnia.  Controlled with meds.  No ADE.  Used PRN.   Atopy controlled with prn use of cream.  No ADE.    Uses flexeril with back spasms with no ADE.  It helps.  Used prn.    He had some back pain that was likely due to mild pancreatitis. Discussed getting a baseline lipase.  He has no sx now.    PMH and SH reviewed  Meds, vitals, and allergies reviewed.   ROS: See HPI.  Otherwise negative.    GEN: nad, alert and oriented HEENT: mucous membranes moist NECK: supple w/o LA CV: rrr. PULM: ctab, no inc wob ABD: soft, +bs EXT: no edema SKIN: no acute rash but dry skin on the R>L shin

## 2013-10-11 LAB — LIPASE: Lipase: 28 U/L (ref 11.0–59.0)

## 2013-10-11 NOTE — Assessment & Plan Note (Signed)
Controlled with prn topical.

## 2013-10-11 NOTE — Assessment & Plan Note (Signed)
Can use prn flexeril. No ADE.

## 2013-10-11 NOTE — Assessment & Plan Note (Signed)
Routine anticipatory guidance given to patient.  See health maintenance. Tetanus 2007 Flu 2014 Shingles and PNA shot not indicated.   Colonoscopy not due now.  No symptoms. Likely with screening due at 52.  PSA not indicated.   Living will prev done.  Wife would be designated if he is incapacitated.   Diet and exercise d/w pt.  He usually does well except for recently on a cruise.  Encouraged.  Sugar wnl, lipids mildly increased.   Discussed.

## 2013-10-11 NOTE — Assessment & Plan Note (Signed)
H/o, no sx now.  Would check lipase to est baseline result.

## 2013-10-11 NOTE — Assessment & Plan Note (Signed)
From shift work, continue as is with ambien prn.

## 2013-11-07 ENCOUNTER — Encounter: Payer: Self-pay | Admitting: Family Medicine

## 2013-12-26 ENCOUNTER — Telehealth: Payer: Self-pay | Admitting: Family Medicine

## 2013-12-26 NOTE — Telephone Encounter (Signed)
Pt wants to know full disclosure of the results of his [facility] labs.  He's very upset over the cost of this test.  I have advised him to contact [facility] and given him the billing # for them.  He still wants to speak w/you about the results and also he wants to know where he can find [facility]'s policy for this type of testing.

## 2013-12-26 NOTE — Telephone Encounter (Signed)
I called him and explained to him to follow the directions below about the billing.  He was tested by routine clinic policy.   He wanted a copy of his drug test.  I advised he may potentially need to sign a record release to get a hard copy. I did give a verbal report of the UDS.   He wants a copy of the [facility]/clinic UDS policy.  Please mail that to him.  If he doesn't need to sign a record release, please send a hard copy of the UDS results also.   If he does need to sign a record release to get the UDS, please let him know.  Thanks.

## 2014-08-19 ENCOUNTER — Encounter: Payer: Self-pay | Admitting: Emergency Medicine

## 2014-08-19 ENCOUNTER — Telehealth: Payer: Self-pay | Admitting: *Deleted

## 2014-08-19 ENCOUNTER — Emergency Department (INDEPENDENT_AMBULATORY_CARE_PROVIDER_SITE_OTHER)
Admission: EM | Admit: 2014-08-19 | Discharge: 2014-08-19 | Disposition: A | Discharge status: Home / Self Care | Payer: BC Managed Care – PPO | Source: Home / Self Care | Attending: Emergency Medicine | Admitting: Emergency Medicine

## 2014-08-19 DIAGNOSIS — R1084 Generalized abdominal pain: Secondary | ICD-10-CM

## 2014-08-19 LAB — CBC WITH DIFFERENTIAL/PLATELET
BASOS ABS: 0.1 10*3/uL (ref 0.0–0.1)
Basophils Relative: 1 % (ref 0–1)
Eosinophils Absolute: 0.3 10*3/uL (ref 0.0–0.7)
Eosinophils Relative: 3 % (ref 0–5)
HEMATOCRIT: 44.4 % (ref 39.0–52.0)
Hemoglobin: 15.1 g/dL (ref 13.0–17.0)
LYMPHS PCT: 16 % (ref 12–46)
Lymphs Abs: 1.5 10*3/uL (ref 0.7–4.0)
MCH: 29 pg (ref 26.0–34.0)
MCHC: 34 g/dL (ref 30.0–36.0)
MCV: 85.4 fL (ref 78.0–100.0)
Monocytes Absolute: 0.7 10*3/uL (ref 0.1–1.0)
Monocytes Relative: 7 % (ref 3–12)
Neutro Abs: 6.9 10*3/uL (ref 1.7–7.7)
Neutrophils Relative %: 73 % (ref 43–77)
PLATELETS: 180 10*3/uL (ref 150–400)
RBC: 5.2 MIL/uL (ref 4.22–5.81)
RDW: 13.9 % (ref 11.5–15.5)
WBC: 9.5 10*3/uL (ref 4.0–10.5)

## 2014-08-19 LAB — POCT URINALYSIS DIP (MANUAL ENTRY)
Bilirubin, UA: NEGATIVE
Blood, UA: NEGATIVE
Glucose, UA: NEGATIVE
Ketones, POC UA: NEGATIVE
Leukocytes, UA: NEGATIVE
Nitrite, UA: NEGATIVE
Protein Ur, POC: NEGATIVE
Spec Grav, UA: 1.015 (ref 1.005–1.03)
Urobilinogen, UA: 0.2 (ref 0–1)
pH, UA: 6 (ref 5–8)

## 2014-08-19 LAB — COMPREHENSIVE METABOLIC PANEL
ALT: 11 U/L (ref 0–53)
AST: 14 U/L (ref 0–37)
Albumin: 4.3 g/dL (ref 3.5–5.2)
Alkaline Phosphatase: 74 U/L (ref 39–117)
BUN: 17 mg/dL (ref 6–23)
CALCIUM: 9.3 mg/dL (ref 8.4–10.5)
CO2: 21 mEq/L (ref 19–32)
Chloride: 100 mEq/L (ref 96–112)
Creat: 1.04 mg/dL (ref 0.50–1.35)
Glucose, Bld: 94 mg/dL (ref 70–99)
POTASSIUM: 4.1 meq/L (ref 3.5–5.3)
Sodium: 135 mEq/L (ref 135–145)
TOTAL PROTEIN: 7.3 g/dL (ref 6.0–8.3)
Total Bilirubin: 0.7 mg/dL (ref 0.2–1.2)

## 2014-08-19 LAB — LIPASE: Lipase: 41 U/L (ref 0–75)

## 2014-08-19 MED ORDER — OXYCODONE-ACETAMINOPHEN 5-325 MG PO TABS: 2.0000 | ORAL_TABLET | ORAL | Status: DC | PRN

## 2014-08-19 MED ORDER — ONDANSETRON 4 MG PO TBDP: 4.0000 mg | ORAL_TABLET | Freq: Three times a day (TID) | ORAL | Status: DC | PRN

## 2014-08-19 NOTE — ED Notes (Signed)
Reports onset of left flank and mid-low abdominal pain 3 days ago; states "feels like when I had pancreatitis"; also wonders about another kidney stone; pain is constant. Has had some nausea but has taken fluids and put himself on low fat diet. Taken Flexeril due to back component, and this has not helped.

## 2014-08-19 NOTE — ED Provider Notes (Signed)
CSN: [id number]     Arrival date & time 08/19/14  0845 History   First MD Initiated Contact with Patient 08/19/14 0913     Chief Complaint  Patient presents with  . Flank Pain  . Abdominal Pain   (Consider location/radiation/quality/duration/timing/severity/associated sxs/prior Treatment) Patient is a 48 y.o. male presenting with flank pain and abdominal pain. The history is provided by the patient. No language interpreter was used.  Flank Pain This is a new problem. The current episode started more than 2 days ago. The problem occurs constantly. The problem has been gradually worsening. Associated symptoms include abdominal pain. Nothing aggravates the symptoms. Nothing relieves the symptoms. He has tried nothing for the symptoms. The treatment provided no relief.  Abdominal Pain Associated symptoms include abdominal pain.  Pt is concerned that he has pancreatitis because he has had in the past.  Pt reports pain also feels simliar to a kidney stone.   Past Medical History  Diagnosis Date  . Nephrolithiasis   . Pancreatitis     no clear source identified  . Collagenous colitis   . Insomnia     due to work cycle at [employer]  . Atopy     in wintertime on legs   Past Surgical History  Procedure Laterality Date  . Ct of abdomen  3/08    mild pancreatitis, inflamed  . Mandible surgery     Family History  Problem Relation Age of Onset  . Kidney disease Mother     from wegner's disease  . Hypertension Father   . Deep vein thrombosis Father   . Diabetes Father   . Prostate cancer Neg Hx   . Colon cancer Neg Hx    History  Substance Use Topics  . Smoking status: Never Smoker   . Smokeless tobacco: Never Used  . Alcohol Use: Yes     Comment: very rare    Review of Systems  Gastrointestinal: Positive for abdominal pain. Negative for nausea, vomiting, diarrhea, constipation, blood in stool, abdominal distention, anal bleeding and rectal pain.  Genitourinary: Positive for flank  pain.  Musculoskeletal: Positive for back pain and myalgias.  All other systems reviewed and are negative.   Allergies  Penicillins  Home Medications   Prior to Admission medications   Medication Sig Start Date End Date Taking? Authorizing Provider  cyclobenzaprine (FLEXERIL) 10 MG tablet Take 1 tablet (10 mg total) by mouth 3 (three) times daily as needed for muscle spasms. 10/10/13   Joaquim NamGraham S Duncan, MD  Multiple Vitamin (MULTIVITAMIN) tablet Take 1 tablet by mouth daily.      Historical Provider, MD  ondansetron (ZOFRAN ODT) 4 MG disintegrating tablet Take 1 tablet (4 mg total) by mouth every 8 (eight) hours as needed for nausea or vomiting. 08/19/14   Elson AreasLeslie K Sofia, PA-C  oxyCODONE-acetaminophen (PERCOCET/ROXICET) 5-325 MG per tablet Take 2 tablets by mouth every 4 (four) hours as needed for severe pain. 08/19/14   Elson AreasLeslie K Sofia, PA-C  triamcinolone cream (KENALOG) 0.1 % Apply 1 application topically 2 (two) times daily as needed. 10/10/13   Joaquim NamGraham S Duncan, MD  zolpidem (AMBIEN) 10 MG tablet Take 0.5-1 tablets (5-10 mg total) by mouth at bedtime as needed. 10/10/13   Joaquim NamGraham S Duncan, MD   BP 150/82  Pulse 96  Temp(Src) 97.6 F (36.4 C) (Oral)  Resp 16  Ht 5\' 11"  (1.803 m)  Wt 245 lb (111.131 kg)  BMI 34.19 kg/m2  SpO2 97% Physical Exam  Nursing note and  vitals reviewed. Constitutional: He is oriented to person, place, and time. He appears well-developed and well-nourished.  HENT:  Head: Normocephalic and atraumatic.  Eyes: EOM are normal. Pupils are equal, round, and reactive to light.  Neck: Normal range of motion.  Cardiovascular: Normal rate and normal heart sounds.   Pulmonary/Chest: Effort normal.  Abdominal: Soft. He exhibits no distension. There is tenderness.  Musculoskeletal: Normal range of motion.  Neurological: He is alert and oriented to person, place, and time.  Skin: Skin is warm.  Psychiatric: He has a normal mood and affect.    ED Course  Procedures  (including critical care time) Labs Review Labs Reviewed  CBC WITH DIFFERENTIAL   Narrative:    Performed at:  [facility]                [address]                [place], [place] [address]  COMPREHENSIVE METABOLIC PANEL   Narrative:    Performed at:  [facility]                [address]                [place], [place] [id number]  LIPASE   Narrative:    Performed at:  [facility]                [address]                [place], [place] [address]  POCT URINALYSIS DIP (MANUAL ENTRY)    Imaging Review No results found.   MDM  Pt is [facility].   Lab results called to pt.   No evidence of pancreatitis.  No blood in urine.  Pt advised to return if symptoms worsen.   He will follow up with Dr. Cheree Dittograham   1. Generalized abdominal pain    Labs pending.   Pt given rx for Percocet and Zofran.   Pt advised to ED if increased pain or vomitting   Elson AreasLeslie K Sofia, PA-C 08/19/14 0931  Elson AreasLeslie K Sofia, PA-C 08/19/14 0931  Elson AreasLeslie K Sofia, PA-C 08/24/14 1233

## 2014-08-19 NOTE — Discharge Instructions (Signed)
Abdominal Pain °Many things can cause abdominal pain. Usually, abdominal pain is not caused by a disease and will improve without treatment. It can often be observed and treated at home. Your health care provider will do a physical exam and possibly order blood tests and X-rays to help determine the seriousness of your pain. However, in many cases, more time must pass before a clear cause of the pain can be found. Before that point, your health care provider may not know if you need more testing or further treatment. °HOME CARE INSTRUCTIONS  °Monitor your abdominal pain for any changes. The following actions may help to alleviate any discomfort you are experiencing: °· Only take over-the-counter or prescription medicines as directed by your health care provider. °· Do not take laxatives unless directed to do so by your health care provider. °· Try a clear liquid diet (broth, tea, or water) as directed by your health care provider. Slowly move to a bland diet as tolerated. °SEEK MEDICAL CARE IF: °· You have unexplained abdominal pain. °· You have abdominal pain associated with nausea or diarrhea. °· You have pain when you urinate or have a bowel movement. °· You experience abdominal pain that wakes you in the night. °· You have abdominal pain that is worsened or improved by eating food. °· You have abdominal pain that is worsened with eating fatty foods. °· You have a fever. °SEEK IMMEDIATE MEDICAL CARE IF:  °· Your pain does not go away within 2 hours. °· You keep throwing up (vomiting). °· Your pain is felt only in portions of the abdomen, such as the right side or the left lower portion of the abdomen. °· You pass bloody or black tarry stools. °MAKE SURE YOU: °· Understand these instructions.   °· Will watch your condition.   °· Will get help right away if you are not doing well or get worse.   °Document Released: 08/04/2005 Document Revised: 10/30/2013 Document Reviewed: 07/04/2013 °ExitCare® Patient Information  ©2015 [employer]. This information is not intended to replace advice given to you by your health care provider. Make sure you discuss any questions you have with your health care provider. ° ° ° °Acute Pancreatitis °Acute pancreatitis is a disease in which the pancreas becomes suddenly inflamed. The pancreas is a large gland located behind your stomach. The pancreas produces enzymes that help digest food. The pancreas also releases the hormones glucagon and insulin that help regulate blood sugar. Damage to the pancreas occurs when the digestive enzymes from the pancreas are activated and begin attacking the pancreas before being released into the intestine. Most acute attacks last a couple of days and can cause serious complications. Some people become dehydrated and develop low blood pressure. In severe cases, bleeding into the pancreas can lead to shock and can be life-threatening. The lungs, heart, and kidneys may fail. °CAUSES  °Pancreatitis can happen to anyone. In some cases, the cause is unknown. Most cases are caused by: °· Alcohol abuse. °· Gallstones. °Other less common causes are: °· Certain medicines. °· Exposure to certain chemicals. °· Infection. °· Damage caused by an accident (trauma). °· Abdominal surgery. °SYMPTOMS  °· Pain in the upper abdomen that may radiate to the back. °· Tenderness and swelling of the abdomen. °· Nausea and vomiting. °DIAGNOSIS  °Your caregiver will perform a physical exam. Blood and stool tests may be done to confirm the diagnosis. Imaging tests may also be done, such as X-rays, CT scans, or an ultrasound of the abdomen. °TREATMENT  °  Treatment usually requires a stay in the hospital. Treatment may include: °· Pain medicine. °· Fluid replacement through an intravenous line (IV). °· Placing a tube in the stomach to remove stomach contents and control vomiting. °· Not eating for 3 or 4 days. This gives your pancreas a rest, because enzymes are not being produced that can  cause further damage. °· Antibiotic medicines if your condition is caused by an infection. °· Surgery of the pancreas or gallbladder. °HOME CARE INSTRUCTIONS  °· Follow the diet advised by your caregiver. This may involve avoiding alcohol and decreasing the amount of fat in your diet. °· Eat smaller, more frequent meals. This reduces the amount of digestive juices the pancreas produces. °· Drink enough fluids to keep your urine clear or pale yellow. °· Only take over-the-counter or prescription medicines as directed by your caregiver. °· Avoid drinking alcohol if it caused your condition. °· Do not smoke. °· Get plenty of rest. °· Check your blood sugar at home as directed by your caregiver. °· Keep all follow-up appointments as directed by your caregiver. °SEEK MEDICAL CARE IF:  °· You do not recover as quickly as expected. °· You develop new or worsening symptoms. °· You have persistent pain, weakness, or nausea. °· You recover and then have another episode of pain. °SEEK IMMEDIATE MEDICAL CARE IF:  °· You are unable to eat or keep fluids down. °· Your pain becomes severe. °· You have a fever or persistent symptoms for more than 2 to 3 days. °· You have a fever and your symptoms suddenly get worse. °· Your skin or the white part of your eyes turn yellow (jaundice). °· You develop vomiting. °· You feel dizzy, or you faint. °· Your blood sugar is high (over 300 mg/dL). °MAKE SURE YOU:  °· Understand these instructions. °· Will watch your condition. °· Will get help right away if you are not doing well or get worse. °Document Released: 10/25/2005 Document Revised: 04/25/2012 Document Reviewed: 02/03/2012 °ExitCare® Patient Information ©2015 [employer]. This information is not intended to replace advice given to you by your health care provider. Make sure you discuss any questions you have with your health care provider. ° °

## 2014-08-26 NOTE — ED Provider Notes (Addendum)
Medical history/examination/treatment/procedure(s) were performed by non-physician provider and as supervising physician I was immediately available for consultation/collaboration.   Lajean Manesavid Massey, MD 08/26/14 [id number]  Lajean Manesavid Massey, MD 08/26/14 [phone]

## 2014-11-06 ENCOUNTER — Telehealth: Payer: Self-pay | Admitting: Family Medicine

## 2014-11-06 DIAGNOSIS — E785 Hyperlipidemia, unspecified: Secondary | ICD-10-CM

## 2014-11-06 NOTE — Telephone Encounter (Signed)
Pt called to schedule cpx in jan.  He wanted to have labs done @ the [facility].  Need lab order in system thanks

## 2014-11-06 NOTE — Telephone Encounter (Signed)
Patient notified as instructed by telephone. 

## 2014-11-06 NOTE — Telephone Encounter (Signed)
All he needs is a lipid panel, since other labs were done in 08/2014.  Ordered.  Thanks.

## 2014-11-15 ENCOUNTER — Other Ambulatory Visit (INDEPENDENT_AMBULATORY_CARE_PROVIDER_SITE_OTHER): Payer: BLUE CROSS/BLUE SHIELD

## 2014-11-15 DIAGNOSIS — E785 Hyperlipidemia, unspecified: Secondary | ICD-10-CM

## 2014-11-15 LAB — LIPID PANEL
CHOL/HDL RATIO: 5
Cholesterol: 203 mg/dL — ABNORMAL HIGH (ref 0–200)
HDL: 38.2 mg/dL — ABNORMAL LOW (ref >39.00)
LDL CALC: 132 mg/dL — AB (ref 0–99)
NonHDL: 164.8
Triglycerides: 162 mg/dL — ABNORMAL HIGH (ref 0.0–149.0)
VLDL: 32.4 mg/dL (ref 0.0–40.0)

## 2014-11-21 ENCOUNTER — Ambulatory Visit (INDEPENDENT_AMBULATORY_CARE_PROVIDER_SITE_OTHER): Payer: BLUE CROSS/BLUE SHIELD | Admitting: Family Medicine

## 2014-11-21 ENCOUNTER — Encounter: Payer: Self-pay | Admitting: Family Medicine

## 2014-11-21 VITALS — BP 128/80 | HR 88 | Temp 98.4°F | Ht 71.0 in | Wt 247.5 lb

## 2014-11-21 DIAGNOSIS — M62838 Other muscle spasm: Secondary | ICD-10-CM

## 2014-11-21 DIAGNOSIS — G4700 Insomnia, unspecified: Secondary | ICD-10-CM

## 2014-11-21 DIAGNOSIS — Z0000 Encounter for general adult medical examination without abnormal findings: Secondary | ICD-10-CM

## 2014-11-21 DIAGNOSIS — R197 Diarrhea, unspecified: Secondary | ICD-10-CM

## 2014-11-21 DIAGNOSIS — Z7189 Other specified counseling: Secondary | ICD-10-CM

## 2014-11-21 MED ORDER — CYCLOBENZAPRINE HCL 10 MG PO TABS: 5.0000 mg | ORAL_TABLET | Freq: Three times a day (TID) | ORAL | Status: DC | PRN

## 2014-11-21 NOTE — Progress Notes (Signed)
Pre visit review using our clinic review tool, if applicable. No additional management support is needed unless otherwise documented below in the visit note.  CPE- See plan. Routine anticipatory guidance given to patient. See health maintenance. Tetanus 2007 Flu 2015 Shingles and PNA shot not indicated. Colonoscopy not due now. See below symptoms. Likely with screening due at 52.  PSA not indicated. Living will prev done. Wife would be designated if he is incapacitated. Diet and exercise d/w pt. Encouraged exercise.  Sugar wnl, lipids d/w pt. Discussed.   Shift work insomnia. Controlled with meds. No ADE. Used PRN.  Has had normal UDS prev, not needed to repeat.  Doesn't need refill now.  Will not need repeat UDS on next rx.   Atopy controlled with prn use of cream. No ADE.   Uses flexeril with back spasms with no ADE. It helps. Used prn.   He had some back pain that was likely due to mild pancreatitis. We had gotten a prev baseline lipase.  He has no sx now.  08/2014- Had some L lower back pain.  It would get better with a massage, then it got worse.  Was evaluated, with neg labs at that point.  It self resolved.   We talked about going back to see GI again.  He has intermittent diarrhea at baseline.  He wanted to defer for now.    PMH and SH reviewed  ROS: See HPI, otherwise noncontributory.  Meds, vitals, and allergies reviewed.   GEN: nad, alert and oriented HEENT: mucous membranes moist NECK: supple w/o LA CV: rrr.  PULM: ctab, no inc wob ABD: soft, +bs EXT: no edema SKIN: no acute rash

## 2014-11-21 NOTE — Patient Instructions (Signed)
Send me a note when you need a refill on the [place].   Take care.  Glad to see you.  Try to get some exercise worked into your schedule.

## 2014-11-22 DIAGNOSIS — Z7189 Other specified counseling: Secondary | ICD-10-CM | POA: Insufficient documentation

## 2014-11-22 NOTE — Assessment & Plan Note (Signed)
Continue prn ambien, no ADE.  Prev with neg UDS.  Will not need uds for next refill. D/w pt.

## 2014-11-22 NOTE — Assessment & Plan Note (Signed)
Continue prn flexeril.  No ADE on med.   

## 2014-11-22 NOTE — Assessment & Plan Note (Signed)
He'll update me if he needs a referral back to GI.

## 2014-11-22 NOTE — Assessment & Plan Note (Signed)
Routine anticipatory guidance given to patient. See health maintenance.  Tetanus 2007  Flu 2015  Shingles and PNA shot not indicated.  Colonoscopy not due now. See below symptoms. Likely with screening due at 52.  PSA not indicated.  Living will prev done. Wife would be designated if he is incapacitated. Diet and exercise d/w pt. Encouraged exercise.  Sugar wnl, lipids d/w pt. Discussed.

## 2015-07-28 ENCOUNTER — Encounter: Payer: Self-pay | Admitting: Family Medicine

## 2015-07-28 ENCOUNTER — Ambulatory Visit (INDEPENDENT_AMBULATORY_CARE_PROVIDER_SITE_OTHER): Payer: BLUE CROSS/BLUE SHIELD | Admitting: Family Medicine

## 2015-07-28 VITALS — BP 112/78 | HR 92 | Temp 98.5°F | Wt 234.0 lb

## 2015-07-28 DIAGNOSIS — G4700 Insomnia, unspecified: Secondary | ICD-10-CM

## 2015-07-28 DIAGNOSIS — K8500 Idiopathic acute pancreatitis without necrosis or infection: Secondary | ICD-10-CM

## 2015-07-28 DIAGNOSIS — T753XXA Motion sickness, initial encounter: Secondary | ICD-10-CM

## 2015-07-28 LAB — CBC WITH DIFFERENTIAL/PLATELET
BASOS PCT: 0.8 % (ref 0.0–3.0)
Basophils Absolute: 0.1 10*3/uL (ref 0.0–0.1)
EOS ABS: 0.3 10*3/uL (ref 0.0–0.7)
EOS PCT: 3.9 % (ref 0.0–5.0)
HCT: 46.6 % (ref 39.0–52.0)
HEMOGLOBIN: 15.6 g/dL (ref 13.0–17.0)
LYMPHS PCT: 23.5 % (ref 12.0–46.0)
Lymphs Abs: 2.1 10*3/uL (ref 0.7–4.0)
MCHC: 33.5 g/dL (ref 30.0–36.0)
MCV: 86.1 fl (ref 78.0–100.0)
Monocytes Absolute: 0.7 10*3/uL (ref 0.1–1.0)
Monocytes Relative: 7.5 % (ref 3.0–12.0)
Neutro Abs: 5.7 10*3/uL (ref 1.4–7.7)
Neutrophils Relative %: 64.3 % (ref 43.0–77.0)
Platelets: 400 10*3/uL (ref 150.0–400.0)
RBC: 5.41 Mil/uL (ref 4.22–5.81)
RDW: 14.5 % (ref 11.5–15.5)
WBC: 8.9 10*3/uL (ref 4.0–10.5)

## 2015-07-28 LAB — LIPASE: Lipase: 46 U/L (ref 11.0–59.0)

## 2015-07-28 MED ORDER — ZOLPIDEM TARTRATE 10 MG PO TABS: 5.0000 mg | ORAL_TABLET | Freq: Every evening | ORAL | Status: DC | PRN

## 2015-07-28 MED ORDER — SCOPOLAMINE 1 MG/3DAYS TD PT72: 1.0000 | MEDICATED_PATCH | TRANSDERMAL | Status: DC

## 2015-07-28 NOTE — Progress Notes (Signed)
Pre visit review using our clinic review tool, if applicable. No additional management support is needed unless otherwise documented below in the visit note.  Back for about 1 week, was a nagging pain.  It would wax and wane.  It would get better with flexeril and ibuprofen.  The pain was then moving to the LUQ, he went in for eval.  Dx pancreatitis, bowel rest, pain meds and fluids.    Outside hospital d/c summary.   Caleb [name] is a 49 y.o. White or Caucasian [1] male who presented to the [facility] on 07/14/2015. He has been generally healthy except for several episodes in the past for recurrent idiopathic pancreatitis. Patient denies any significant alcohol use, he is not on any specific causative medications or drugs, and workup for stone has been negative. He has a fairly normal lipid panel, without significant hypertriglyceridemia.  He'll return to the hospital complaining of several days of progressive back pain and epigastric/left sided abdominal discomfort. The pain has become extremely severe. He thinks it may have been precipitated by a fatty meal. In the emergency room, laboratory studies showed a lipase over 600, and an amylase of 1468, though his blood cell count and LFTs were fairly normal. CT of the abdomen did suggest mild pancreatitis without complications. This was similar but worse than previous exam in 2014. The CT scan suggested possible gallstone though no biliary system dilatation was evident. He was admitted for further evaluation.  He was started on IV hydration, bowel rest, and aggressive analgesics. An ultrasound was obtained to evaluate the possibility of stone. This showed no acute findings, specifically no stones. We attempted to order a HIDA scan; and to do this, we took him off his narcotics temporarily. However this was met with severe exacerbation of his adult discomfort. We abandoned the idea of a HIDA scan, and decided to involve  gastroenterology.  With their help, we chose to proceed with an MRCP, which was essentially negative other than the previously described pancreatitis. During this time he had some episodes of fevers and worsening of his leukocytosis. However, with the absence of any other infective findings, and fairly bland appearing pancreatitis, without evidence of extra pancreatic fluid collections, we decided to continue the course of bowel rest, etc. To meet his pain control needs we actually used a Dilaudid PCA pump.  Continuing this, we noted a progressive decline in his leukocytosis and elevated lipase. His charge his white blood cell count was nearly normal and his lipase has normalized. His pain and nausea were somewhat slower to respond. However by day of discharge, he had essentially weaned off of IV narcotics. He is now tolerating a low-fat diet without difficulty. He is now very eager to be discharged home.  I think he is stable to do so. We will continue some Norco and Phenergan as needed. Continue proton pump inhibitor. We'll refer him to follow-up with gastroenterology to plan further diagnostic maneuvers in order to see if we can break this cycle of pancreatitis. Studies that have been suggested include endoscopic ultrasound. We certainly could consider HIDA scan if his clinical picture warrants. In the end he may benefit for surgical consultation to consider cholecystectomy.  Further update today: Now w/o abd pain, mildly achy.  He is tired, eating low fat diet.  Has gone back to work.  No FCNAVD now.  Prev chills resolved.  No blood in stool.   D/w pt about GI vs surgery eval vs HIDA.  He wanted to go to GI and doesn't need a referral.    Flu shot to be done at work.    PMH and SH reviewed  ROS: See HPI, otherwise noncontributory.  Meds, vitals, and allergies reviewed.   GEN: nad, alert and oriented HEENT: mucous membranes moist NECK: supple w/o LA CV: rrr.  PULM: ctab, no inc wob ABD:  soft, +bs, minimally ttp in the LUQ, but no rebound.  RUQ not ttp EXT: no edema SKIN: no acute rash No juandice.

## 2015-07-28 NOTE — Patient Instructions (Signed)
Go to the lab on the way out.  We'll contact you with your lab report. Ask about seeing GI and I'll await the consult note.   Take care. Glad to see you.

## 2015-07-29 ENCOUNTER — Encounter: Payer: Self-pay | Admitting: Family Medicine

## 2015-07-29 DIAGNOSIS — T753XXA Motion sickness, initial encounter: Secondary | ICD-10-CM | POA: Insufficient documentation

## 2015-07-29 NOTE — Assessment & Plan Note (Signed)
Going on a cruise this fall, rx done for scop patch per patient request.

## 2015-07-29 NOTE — Assessment & Plan Note (Signed)
Clearly improved, but with recurrent sx over the years.  No clear source.  D/w pt.  He'll f/u with GI, this is reasonable.   Recheck basic labs today on the way out.  He agrees.  Okay for outpatient f/u.

## 2015-07-29 NOTE — Assessment & Plan Note (Signed)
Continues at baseline, intermittently, refill done for patient.  No ADE on med.

## 2016-01-01 ENCOUNTER — Ambulatory Visit (INDEPENDENT_AMBULATORY_CARE_PROVIDER_SITE_OTHER): Payer: BLUE CROSS/BLUE SHIELD | Admitting: Family Medicine

## 2016-01-01 ENCOUNTER — Encounter: Payer: Self-pay | Admitting: Family Medicine

## 2016-01-01 VITALS — BP 126/90 | HR 87 | Temp 98.3°F | Ht 71.0 in | Wt 234.2 lb

## 2016-01-01 DIAGNOSIS — E785 Hyperlipidemia, unspecified: Secondary | ICD-10-CM | POA: Diagnosis not present

## 2016-01-01 DIAGNOSIS — Z23 Encounter for immunization: Secondary | ICD-10-CM | POA: Diagnosis not present

## 2016-01-01 DIAGNOSIS — G4700 Insomnia, unspecified: Secondary | ICD-10-CM

## 2016-01-01 DIAGNOSIS — Z0000 Encounter for general adult medical examination without abnormal findings: Secondary | ICD-10-CM | POA: Diagnosis not present

## 2016-01-01 DIAGNOSIS — Z889 Allergy status to unspecified drugs, medicaments and biological substances status: Secondary | ICD-10-CM

## 2016-01-01 DIAGNOSIS — M62838 Other muscle spasm: Secondary | ICD-10-CM | POA: Diagnosis not present

## 2016-01-01 LAB — LIPID PANEL
CHOLESTEROL: 191 mg/dL (ref 0–200)
HDL: 39.8 mg/dL (ref >39.00)
LDL Cholesterol: 118 mg/dL — ABNORMAL HIGH (ref 0–99)
NonHDL: 150.72
TRIGLYCERIDES: 166 mg/dL — AB (ref 0.0–149.0)
Total CHOL/HDL Ratio: 5
VLDL: 33.2 mg/dL (ref 0.0–40.0)

## 2016-01-01 LAB — COMPREHENSIVE METABOLIC PANEL
ALT: 17 U/L (ref 0–53)
AST: 19 U/L (ref 0–37)
Albumin: 4.3 g/dL (ref 3.5–5.2)
Alkaline Phosphatase: 60 U/L (ref 39–117)
BILIRUBIN TOTAL: 0.8 mg/dL (ref 0.2–1.2)
BUN: 15 mg/dL (ref 6–23)
CALCIUM: 9.7 mg/dL (ref 8.4–10.5)
CO2: 30 mEq/L (ref 19–32)
Chloride: 104 mEq/L (ref 96–112)
Creatinine, Ser: 1.13 mg/dL (ref 0.40–1.50)
GFR: 73.88 mL/min (ref >60.00)
Glucose, Bld: 93 mg/dL (ref 70–99)
Potassium: 4.3 mEq/L (ref 3.5–5.1)
Sodium: 138 mEq/L (ref 135–145)
Total Protein: 7.5 g/dL (ref 6.0–8.3)

## 2016-01-01 MED ORDER — CYCLOBENZAPRINE HCL 10 MG PO TABS: 5.0000 mg | ORAL_TABLET | Freq: Three times a day (TID) | ORAL | Status: DC | PRN

## 2016-01-01 MED ORDER — TRIAMCINOLONE ACETONIDE 0.1 % EX CREA: 1.0000 "application " | TOPICAL_CREAM | Freq: Two times a day (BID) | CUTANEOUS | Status: DC | PRN

## 2016-01-01 NOTE — Assessment & Plan Note (Signed)
Tetanus 2017 Flu 2016 at work.  Shingles and PNA shot not indicated. Colonoscopy not due now. See below symptoms. Likely with screening due at 52.  PSA not indicated. Living will prev done. Wife would be designated if he is incapacitated. Diet and exercise d/w pt. Encouraged exercise.  Labs pending.   HIV screening declined.

## 2016-01-01 NOTE — Assessment & Plan Note (Signed)
Continue prn flexeril.  No ADE on med.   

## 2016-01-01 NOTE — Progress Notes (Signed)
Pre visit review using our clinic review tool, if applicable. No additional management support is needed unless otherwise documented below in the visit note.  CPE- See plan.  Routine anticipatory guidance given to patient.  See health maintenance. Tetanus 2017 Flu 2016 at work.  Shingles and PNA shot not indicated. Colonoscopy not due now. See below symptoms. Likely with screening due at 52.  PSA not indicated. Living will prev done. Wife would be designated if he is incapacitated. Diet and exercise d/w pt. Encouraged exercise.  Labs pending.   HIV screening declined.   Shift work insomnia. Controlled with meds. No ADE. Used PRN. Has had normal UDS prev, not needed to repeat. Doesn't need refill now.  Atopy controlled with prn use of cream. No ADE.   Uses flexeril with back spasms with no ADE. It helps. Used prn.   Had seen GI re: prev pancreatitis with rec to continue ASA in meantime.  No more flares in the meantime.    PMH and SH reviewed  Meds, vitals, and allergies reviewed.   ROS: See HPI.  Otherwise negative.    GEN: nad, alert and oriented HEENT: mucous membranes moist NECK: supple w/o LA CV: rrr. PULM: ctab, no inc wob ABD: soft, +bs EXT: no edema SKIN: no acute rash but cracked skin noted on the fingers, near the nail edges.

## 2016-01-01 NOTE — Patient Instructions (Signed)
Take care. Glad to see you.  See what you can do to get more exercise.

## 2016-01-01 NOTE — Assessment & Plan Note (Signed)
Continue prn ambien.  No ADE on med.

## 2016-01-01 NOTE — Assessment & Plan Note (Signed)
Continue prn TAC. Update me as needed.

## 2016-01-02 ENCOUNTER — Encounter: Payer: Self-pay | Admitting: *Deleted

## 2016-12-20 DIAGNOSIS — H524 Presbyopia: Secondary | ICD-10-CM | POA: Diagnosis not present

## 2017-01-07 ENCOUNTER — Encounter: Payer: Self-pay | Admitting: Family Medicine

## 2017-01-07 ENCOUNTER — Ambulatory Visit (INDEPENDENT_AMBULATORY_CARE_PROVIDER_SITE_OTHER): Payer: BLUE CROSS/BLUE SHIELD | Admitting: Family Medicine

## 2017-01-07 VITALS — BP 130/88 | HR 91 | Temp 97.8°F | Resp 16 | Ht 71.5 in | Wt 246.2 lb

## 2017-01-07 DIAGNOSIS — Z8719 Personal history of other diseases of the digestive system: Secondary | ICD-10-CM

## 2017-01-07 DIAGNOSIS — E785 Hyperlipidemia, unspecified: Secondary | ICD-10-CM

## 2017-01-07 DIAGNOSIS — Z0000 Encounter for general adult medical examination without abnormal findings: Secondary | ICD-10-CM | POA: Diagnosis not present

## 2017-01-07 DIAGNOSIS — R000 Tachycardia, unspecified: Secondary | ICD-10-CM | POA: Diagnosis not present

## 2017-01-07 DIAGNOSIS — G4700 Insomnia, unspecified: Secondary | ICD-10-CM

## 2017-01-07 LAB — LIPID PANEL
CHOLESTEROL: 203 mg/dL — AB (ref 0–200)
HDL: 38.3 mg/dL — ABNORMAL LOW (ref >39.00)
LDL CALC: 127 mg/dL — AB (ref 0–99)
NonHDL: 164.61
Total CHOL/HDL Ratio: 5
Triglycerides: 187 mg/dL — ABNORMAL HIGH (ref 0.0–149.0)
VLDL: 37.4 mg/dL (ref 0.0–40.0)

## 2017-01-07 LAB — COMPREHENSIVE METABOLIC PANEL
ALBUMIN: 4.3 g/dL (ref 3.5–5.2)
ALT: 15 U/L (ref 0–53)
AST: 16 U/L (ref 0–37)
Alkaline Phosphatase: 52 U/L (ref 39–117)
BUN: 17 mg/dL (ref 6–23)
CALCIUM: 9.4 mg/dL (ref 8.4–10.5)
CO2: 29 mEq/L (ref 19–32)
CREATININE: 1.15 mg/dL (ref 0.40–1.50)
Chloride: 102 mEq/L (ref 96–112)
GFR: 72.09 mL/min (ref >60.00)
Glucose, Bld: 98 mg/dL (ref 70–99)
Potassium: 4.2 mEq/L (ref 3.5–5.1)
Sodium: 137 mEq/L (ref 135–145)
Total Bilirubin: 0.6 mg/dL (ref 0.2–1.2)
Total Protein: 7.4 g/dL (ref 6.0–8.3)

## 2017-01-07 LAB — CBC WITH DIFFERENTIAL/PLATELET
BASOS ABS: 0.1 10*3/uL (ref 0.0–0.1)
Basophils Relative: 0.9 % (ref 0.0–3.0)
EOS ABS: 0.2 10*3/uL (ref 0.0–0.7)
Eosinophils Relative: 2.5 % (ref 0.0–5.0)
HCT: 44.3 % (ref 39.0–52.0)
Hemoglobin: 15.1 g/dL (ref 13.0–17.0)
LYMPHS PCT: 22.8 % (ref 12.0–46.0)
Lymphs Abs: 1.6 10*3/uL (ref 0.7–4.0)
MCHC: 34 g/dL (ref 30.0–36.0)
MCV: 86.5 fl (ref 78.0–100.0)
Monocytes Absolute: 0.6 10*3/uL (ref 0.1–1.0)
Monocytes Relative: 7.9 % (ref 3.0–12.0)
Neutro Abs: 4.6 10*3/uL (ref 1.4–7.7)
Neutrophils Relative %: 65.9 % (ref 43.0–77.0)
Platelets: 200 10*3/uL (ref 150.0–400.0)
RBC: 5.13 Mil/uL (ref 4.22–5.81)
RDW: 14 % (ref 11.5–15.5)
WBC: 7 10*3/uL (ref 4.0–10.5)

## 2017-01-07 LAB — TSH: TSH: 1.57 u[IU]/mL (ref 0.35–4.50)

## 2017-01-07 LAB — LIPASE: Lipase: 30 U/L (ref 11.0–59.0)

## 2017-01-07 MED ORDER — TRIAMCINOLONE ACETONIDE 0.1 % EX CREA: 1.0000 "application " | TOPICAL_CREAM | Freq: Two times a day (BID) | CUTANEOUS | 12 refills | Status: DC | PRN

## 2017-01-07 MED ORDER — ZOLPIDEM TARTRATE 10 MG PO TABS: 5.0000 mg | ORAL_TABLET | Freq: Every evening | ORAL | 5 refills | Status: DC | PRN

## 2017-01-07 MED ORDER — CYCLOBENZAPRINE HCL 10 MG PO TABS: 5.0000 mg | ORAL_TABLET | Freq: Three times a day (TID) | ORAL | 12 refills | Status: DC | PRN

## 2017-01-07 NOTE — Progress Notes (Signed)
Pre-visit discussion using our clinic review tool. No additional management support is needed unless otherwise documented below in the visit note.  

## 2017-01-07 NOTE — Patient Instructions (Addendum)
Go to the lab on the way out.  We'll contact you with your lab report. We'll be in touch.  Take care.  Glad to see you. 

## 2017-01-07 NOTE — Progress Notes (Signed)
CPE- See plan.  Routine anticipatory guidance given to patient.  See health maintenance. Flu 2017 at work.  Shingles and PNA shot not indicated. Colonoscopy not due now. PSA not indicated. Living will prev done. Wife would be designated if he is incapacitated. Diet and exercise d/w pt. Encouraged exercise- getting on treadmill a few times a week.   Labs pending.   [facility] and HCV [facility] ([facility])  Shift work insomnia.  Using ambien prn, no ADE.  Compliant with prn use.   He will be asleep and then wakes up and feels like he has some heart racing, up to low 100s.  No CP, SOB, no sweats o/w.  No exertional sx.  Not related to caffeine.  Can last for about 20min after waking and then resolves.  His pulse feels regular at all times. Doesn't wake up gasping for air.  No known OSA.  Going on for a few months.  Happens about 1-2 times a week.  No clear trigger.    PMH and SH reviewed  Meds, vitals, and allergies reviewed.   ROS: Per HPI.  Unless specifically indicated otherwise in HPI, the patient denies:  General: fever. Eyes: acute vision changes ENT: sore throat Cardiovascular: chest pain Respiratory: SOB GI: vomiting GU: dysuria Musculoskeletal: acute back pain Derm: acute rash Neuro: acute motor dysfunction Psych: worsening mood Endocrine: polydipsia Heme: bleeding Allergy: hayfever  GEN: nad, alert and oriented HEENT: mucous membranes moist NECK: supple w/o LA CV: rrr. PULM: ctab, no inc wob ABD: soft, +bs EXT: no edema SKIN: no acute rash  EKG wnl, d/w pt at [facility].

## 2017-01-09 DIAGNOSIS — R000 Tachycardia, unspecified: Secondary | ICD-10-CM | POA: Insufficient documentation

## 2017-01-09 NOTE — Assessment & Plan Note (Signed)
Flu 2017 at work.  Shingles and PNA shot not indicated. Colonoscopy not due now. PSA not indicated. Living will prev done. Wife would be designated if he is incapacitated. Diet and exercise d/w pt. Encouraged exercise- getting on treadmill a few times a week.   Labs pending.   [facility] and HCV screening [facility] ([facility])  Also with history of pancreatitis. No symptoms now.  See notes on routine labs.

## 2017-01-09 NOTE — Assessment & Plan Note (Signed)
Unclear source. See notes on labs. If this continues we may need refer him to the sleep clinic. EKG today unremarkable.  He can exercise during the day without troubles.

## 2017-01-09 NOTE — Assessment & Plan Note (Signed)
Continue as needed use of Ambien related to shift work disorder. No adverse effect on medication. Routine cautions given.

## 2018-01-09 DIAGNOSIS — R1011 Right upper quadrant pain: Secondary | ICD-10-CM | POA: Diagnosis not present

## 2018-01-09 DIAGNOSIS — Z79899 Other long term (current) drug therapy: Secondary | ICD-10-CM | POA: Diagnosis not present

## 2018-01-09 DIAGNOSIS — H524 Presbyopia: Secondary | ICD-10-CM | POA: Diagnosis not present

## 2018-01-09 DIAGNOSIS — Z880 Allergy status to penicillin: Secondary | ICD-10-CM | POA: Diagnosis not present

## 2018-01-09 DIAGNOSIS — K859 Acute pancreatitis without necrosis or infection, unspecified: Secondary | ICD-10-CM | POA: Diagnosis not present

## 2018-01-09 DIAGNOSIS — K828 Other specified diseases of gallbladder: Secondary | ICD-10-CM | POA: Diagnosis not present

## 2018-01-09 DIAGNOSIS — K8020 Calculus of gallbladder without cholecystitis without obstruction: Secondary | ICD-10-CM | POA: Diagnosis not present

## 2018-01-12 ENCOUNTER — Encounter: Payer: Self-pay | Admitting: Family Medicine

## 2018-01-12 ENCOUNTER — Ambulatory Visit (INDEPENDENT_AMBULATORY_CARE_PROVIDER_SITE_OTHER): Payer: BLUE CROSS/BLUE SHIELD | Admitting: Family Medicine

## 2018-01-12 VITALS — BP 162/102 | HR 94 | Temp 98.1°F | Ht 72.0 in | Wt 243.5 lb

## 2018-01-12 DIAGNOSIS — Z889 Allergy status to unspecified drugs, medicaments and biological substances status: Secondary | ICD-10-CM

## 2018-01-12 DIAGNOSIS — Z7189 Other specified counseling: Secondary | ICD-10-CM

## 2018-01-12 DIAGNOSIS — I10 Essential (primary) hypertension: Secondary | ICD-10-CM

## 2018-01-12 DIAGNOSIS — N529 Male erectile dysfunction, unspecified: Secondary | ICD-10-CM

## 2018-01-12 DIAGNOSIS — Z0000 Encounter for general adult medical examination without abnormal findings: Secondary | ICD-10-CM

## 2018-01-12 DIAGNOSIS — K859 Acute pancreatitis without necrosis or infection, unspecified: Secondary | ICD-10-CM

## 2018-01-12 DIAGNOSIS — G4700 Insomnia, unspecified: Secondary | ICD-10-CM

## 2018-01-12 MED ORDER — CYCLOBENZAPRINE HCL 10 MG PO TABS: 5.0000 mg | ORAL_TABLET | Freq: Three times a day (TID) | ORAL | 12 refills | Status: DC | PRN

## 2018-01-12 MED ORDER — LISINOPRIL 10 MG PO TABS: 5.0000 mg | ORAL_TABLET | Freq: Every day | ORAL | 3 refills | Status: DC

## 2018-01-12 MED ORDER — TRIAMCINOLONE ACETONIDE 0.1 % EX CREA: 1.0000 "application " | TOPICAL_CREAM | Freq: Two times a day (BID) | CUTANEOUS | 12 refills | Status: DC | PRN

## 2018-01-12 MED ORDER — ZOLPIDEM TARTRATE 10 MG PO TABS: 5.0000 mg | ORAL_TABLET | Freq: Every evening | ORAL | 5 refills | Status: DC | PRN

## 2018-01-12 NOTE — Progress Notes (Signed)
CPE- See plan.  Routine anticipatory guidance given to patient.  See health maintenance.  The possibility exists that previously documented standard health maintenance information may have been brought forward from a previous encounter into this note.  If needed, that same information has been updated to reflect the current situation based on today's encounter.    Flu 2018 at work.  Shingles and PNA shot not indicated. Colonoscopy not due now. PSA not indicated. Living will prev done. Wife would be designated if he is incapacitated. Diet and exercise d/w pt. Encouraged both.   Labs recently done at outside ER.  D/w pt.   [facility] and HCV screening prev [facility] (2017)  ED recently noted, mild.  He asked about recheck T.  D/w pt.  He needs weight loss via diet and exercise regardless of T level.   BP elevated out of clinic on checks.  No CP, SOB. D/w pt.   BP elevated today.    Also with history of pancreatitis. No symptoms now.  Recent ER eval for abd pain, resolved with unremarkable labs except for lipase 66.    Work shift with [employer] use, prn.  No ADE on med.    TAC used prn, no ADE on med.    Used flexeril when needed for back pain.    PMH and SH reviewed  Meds, vitals, and allergies reviewed.   ROS: Per HPI.  Unless specifically indicated otherwise in HPI, the patient denies:  General: fever. Eyes: acute vision changes ENT: sore throat Cardiovascular: chest pain Respiratory: SOB GI: vomiting GU: dysuria Musculoskeletal: acute back pain Derm: acute rash Neuro: acute motor dysfunction Psych: worsening mood Endocrine: polydipsia Heme: bleeding Allergy: hayfever  GEN: nad, alert and oriented HEENT: mucous membranes moist NECK: supple w/o LA CV: rrr. PULM: ctab, no inc wob ABD: soft, +bs EXT: no edema SKIN: no acute rash

## 2018-01-12 NOTE — Patient Instructions (Addendum)
Add on 5 mg lisinopril.  If BP still >140/>90 after about 10 days then increase to 10mg  a day.   Update me if not controlled or if not tolerated.  Recheck labs in about 1 month.  Take care.  Glad to see you.

## 2018-01-15 DIAGNOSIS — N529 Male erectile dysfunction, unspecified: Secondary | ICD-10-CM | POA: Insufficient documentation

## 2018-01-15 DIAGNOSIS — I10 Essential (primary) hypertension: Secondary | ICD-10-CM | POA: Insufficient documentation

## 2018-01-15 NOTE — Assessment & Plan Note (Signed)
Continue triamcinolone as needed.

## 2018-01-15 NOTE — Assessment & Plan Note (Signed)
Living will prev done.  Wife would be designated if he is incapacitated. ?

## 2018-01-15 NOTE — Assessment & Plan Note (Signed)
History of.  No symptoms now.  I will defer.

## 2018-01-15 NOTE — Assessment & Plan Note (Signed)
Continue as needed Ambien.  No adverse effect of medication.  He has symptoms related to shift work and schedule changes.

## 2018-01-15 NOTE — Assessment & Plan Note (Signed)
ED recently noted, mild.  He asked about recheck T.  D/w pt.  He needs weight loss via diet and exercise regardless of T level.  I am willing to check the lab but I will not put him on supplemental testosterone.  If he truly has recurrent low testosterone levels we can refer him to urology.  D/w pt.

## 2018-01-15 NOTE — Assessment & Plan Note (Signed)
Needs diet, exercise, weight loss.  Start lisinopril.  Start 5 mg a day.  Increase to 10 mg blood pressure is not controlled.  See after visit summary.  He will get follow-up labs done.

## 2018-01-15 NOTE — Assessment & Plan Note (Signed)
Flu 2018 at work.  Shingles and PNA shot not indicated. Colonoscopy not due now. PSA not indicated. Living will prev done. Wife would be designated if he is incapacitated. Diet and exercise d/w pt. Encouraged both.   Labs recently done at outside ER.  D/w pt.   [facility] and [facility] ([facility])

## 2018-01-27 ENCOUNTER — Other Ambulatory Visit (INDEPENDENT_AMBULATORY_CARE_PROVIDER_SITE_OTHER): Payer: BLUE CROSS/BLUE SHIELD

## 2018-01-27 DIAGNOSIS — I10 Essential (primary) hypertension: Secondary | ICD-10-CM | POA: Diagnosis not present

## 2018-01-27 DIAGNOSIS — N529 Male erectile dysfunction, unspecified: Secondary | ICD-10-CM

## 2018-01-27 LAB — BASIC METABOLIC PANEL
BUN: 17 mg/dL (ref 6–23)
CALCIUM: 9.6 mg/dL (ref 8.4–10.5)
CO2: 26 meq/L (ref 19–32)
Chloride: 105 mEq/L (ref 96–112)
Creatinine, Ser: 1.25 mg/dL (ref 0.40–1.50)
GFR: 65.19 mL/min (ref >60.00)
GLUCOSE: 98 mg/dL (ref 70–99)
Potassium: 4.7 mEq/L (ref 3.5–5.1)
SODIUM: 142 meq/L (ref 135–145)

## 2018-01-27 LAB — LIPID PANEL
CHOLESTEROL: 192 mg/dL (ref 0–200)
HDL: 42.5 mg/dL (ref >39.00)
LDL Cholesterol: 113 mg/dL — ABNORMAL HIGH (ref 0–99)
NonHDL: 149.84
Total CHOL/HDL Ratio: 5
Triglycerides: 184 mg/dL — ABNORMAL HIGH (ref 0.0–149.0)
VLDL: 36.8 mg/dL (ref 0.0–40.0)

## 2018-01-27 LAB — TESTOSTERONE: TESTOSTERONE: 312.97 ng/dL (ref 300.00–890.00)

## 2018-02-20 ENCOUNTER — Other Ambulatory Visit: Payer: Self-pay

## 2018-02-20 ENCOUNTER — Emergency Department (INDEPENDENT_AMBULATORY_CARE_PROVIDER_SITE_OTHER)
Admission: EM | Admit: 2018-02-20 | Discharge: 2018-02-20 | Disposition: A | Discharge status: Home / Self Care | Payer: BLUE CROSS/BLUE SHIELD | Source: Home / Self Care | Attending: Family Medicine | Admitting: Family Medicine

## 2018-02-20 DIAGNOSIS — R1013 Epigastric pain: Secondary | ICD-10-CM | POA: Diagnosis not present

## 2018-02-20 DIAGNOSIS — R110 Nausea: Secondary | ICD-10-CM | POA: Diagnosis not present

## 2018-02-20 LAB — POCT CBC W AUTO DIFF (K'VILLE URGENT CARE)

## 2018-02-20 LAB — POCT URINALYSIS DIP (MANUAL ENTRY)
Bilirubin, UA: NEGATIVE
Blood, UA: NEGATIVE
Glucose, UA: NEGATIVE mg/dL
Ketones, POC UA: NEGATIVE mg/dL
Leukocytes, UA: NEGATIVE
Nitrite, UA: NEGATIVE
PROTEIN UA: NEGATIVE mg/dL
Spec Grav, UA: 1.01 (ref 1.010–1.025)
Urobilinogen, UA: 0.2 E.U./dL
pH, UA: 7 (ref 5.0–8.0)

## 2018-02-20 MED ORDER — HYDROCODONE-ACETAMINOPHEN 5-325 MG PO TABS: 1.0000 | ORAL_TABLET | Freq: Four times a day (QID) | ORAL | 0 refills | Status: DC | PRN

## 2018-02-20 MED ORDER — PROMETHAZINE HCL 25 MG PO TABS: 25.0000 mg | ORAL_TABLET | Freq: Three times a day (TID) | ORAL | 0 refills | Status: DC | PRN

## 2018-02-20 NOTE — Discharge Instructions (Addendum)
Continue clear liquid diet.  May begin a SUPER[employer] INCBR[employer] diet (Bananas, Rice, Applesauce, Toast) when nausea and abdominal pain resolved. Then gradually advance to a regular diet as tolerated.   If symptoms become significantly worse during the night or over the weekend, proceed to the local emergency room.

## 2018-02-20 NOTE — ED Triage Notes (Signed)
Pain in the epigastric area, along with nausea, radiating to the left side started Wednesday and has been persistent since.

## 2018-02-20 NOTE — ED Provider Notes (Signed)
[facility]    CSN: [id number] Arrival date & time: 02/20/18  1700     History   Chief Complaint Chief Complaint  Patient presents with  . Abdominal Pain  . Back Pain  . Nausea    HPI Caleb PoundsRobert E Yamaguchi is a 52 y.o. male.   Patient complains of 5 day history of intermittent epigastric pain radiating to his left side and back, now more constant since yesterday.  He has had nausea without vomiting, and he has been limiting his intake to liquids.  No change in bowel movements.  Urination normal. No fevers, chills, and sweats. He has a past history of pancreatitis in 2016.  A gallbladder ultrasound 01/09/18 showed multiple gallstones and borderline gallbladder wall thickening without biliary dilatation.  The history is provided by the patient.  Abdominal Pain  Pain location:  Epigastric Pain quality: aching and gnawing   Pain radiation: left chest and back. Pain severity:  Mild Onset quality:  Sudden Duration:  5 days Timing:  Constant Progression:  Unchanged Chronicity:  Recurrent Context: awakening from sleep   Context: not alcohol use, not diet changes, not eating, not medication withdrawal, not previous surgeries, not recent illness, not recent travel, not sick contacts, not suspicious food intake and not trauma   Relieved by:  None tried Worsened by:  Eating Ineffective treatments:  None tried Associated symptoms: anorexia and nausea   Associated symptoms: no belching, no chest pain, no chills, no constipation, no cough, no diarrhea, no dysuria, no fatigue, no fever, no flatus, no hematemesis, no hematochezia, no hematuria, no melena, no shortness of breath and no vomiting   Risk factors comment:  History of pancreatitis   Past Medical History:  Diagnosis Date  . Atopy    in wintertime on legs  . Collagenous colitis   . Insomnia    due to work cycle at [employer]  . Nephrolithiasis   . Pancreatitis    no clear source identified, mult episodes    Patient Active  Problem List   Diagnosis Date Noted  . Erectile dysfunction 01/15/2018  . Essential hypertension 01/15/2018  . Tachycardia 01/09/2017  . Motion sickness 07/29/2015  . Advance care planning 11/22/2014  . Fatigue 08/26/2012  . Routine general medical examination at a health care facility 08/12/2011  . Insomnia 08/12/2011  . Muscle spasm 08/12/2011  . Atopy 08/12/2011  . PANCREATITIS, ACUTE 01/01/2008  . NEPHROLITHIASIS 01/01/2008  . COLLAGENOUS COLITIS 01/01/2008  . DIARRHEA 10/25/2007    Past Surgical History:  Procedure Laterality Date  . CT of abdomen  3/08   mild pancreatitis, inflamed  . MANDIBLE SURGERY         Home Medications    Prior to Admission medications   Medication Sig Start Date End Date Taking? Authorizing Provider  aspirin 81 MG tablet Take 81 mg by mouth daily.    [provider]  cyclobenzaprine (FLEXERIL) 10 MG tablet Take 0.5-1 tablets (5-10 mg total) by mouth 3 (three) times daily as needed for muscle spasms (sedation caution). 01/12/18   Joaquim Namuncan, Graham S, MD  HYDROcodone-acetaminophen (NORCO/VICODIN) 5-325 MG tablet Take 1 tablet by mouth every 6 (six) hours as needed for moderate pain. 02/20/18   Lattie HawBeese, Stephen A, MD  lisinopril (PRINIVIL,ZESTRIL) 10 MG tablet Take 0.5-1 tablets (5-10 mg total) by mouth daily. 01/12/18   Joaquim Namuncan, Graham S, MD  Multiple Vitamin (MULTIVITAMIN) tablet Take 1 tablet by mouth daily.      [provider]  promethazine (PHENERGAN) 25  MG tablet Take 1 tablet (25 mg total) by mouth every 8 (eight) hours as needed for nausea or vomiting. 02/20/18   Lattie Haw, MD  triamcinolone cream (KENALOG) 0.1 % Apply 1 application topically 2 (two) times daily as needed. 01/12/18   Joaquim Nam, MD  zolpidem (AMBIEN) 10 MG tablet Take 0.5-1 tablets (5-10 mg total) by mouth at bedtime as needed. 01/12/18   Joaquim Nam, MD    Family History Family History  Problem Relation Age of Onset  . Kidney disease Mother         from wegner's disease  . Hypertension Father   . Deep vein thrombosis Father   . Diabetes Father   . Hypertension Sister   . Prostate cancer Neg Hx   . Colon cancer Neg Hx     Social History Social History   Tobacco Use  . Smoking status: Never Smoker  . Smokeless tobacco: Never Used  Substance Use Topics  . Alcohol use: Yes    Alcohol/week: 0.0 oz    Comment: very rare  . Drug use: No     Allergies   Penicillins   Review of Systems Review of Systems  Constitutional: Negative for chills, fatigue and fever.  Respiratory: Negative for cough and shortness of breath.   Cardiovascular: Negative for chest pain.  Gastrointestinal: Positive for abdominal pain, anorexia and nausea. Negative for constipation, diarrhea, flatus, hematemesis, hematochezia, melena and vomiting.  Genitourinary: Negative for dysuria and hematuria.  All other systems reviewed and are negative.    Physical Exam Triage Vital Signs ED Triage Vitals  Enc Vitals Group     BP 02/20/18 1728 (!) 156/105     Pulse Rate 02/20/18 1728 95     Resp --      Temp 02/20/18 1728 98.4 F (36.9 C)     Temp Source 02/20/18 1728 Oral     SpO2 02/20/18 1728 98 %     Weight 02/20/18 1732 245 lb (111.1 kg)     Height 02/20/18 1732 5\' 11"  (1.803 m)     Head Circumference --      Peak Flow --      Pain Score 02/20/18 1731 5     Pain Loc --      Pain Edu? --      Excl. in GC? --    No data found.  Updated Vital Signs BP (!) 156/105 (BP Location: Right Arm)   Pulse 95   Temp 98.4 F (36.9 C) (Oral)   Ht 5\' 11"  (1.803 m)   Wt 245 lb (111.1 kg)   SpO2 98%   BMI 34.17 kg/m   Visual Acuity Right Eye Distance:   Left Eye Distance:   Bilateral Distance:    Right Eye Near:   Left Eye Near:    Bilateral Near:     Physical Exam  Constitutional: He appears well-developed and well-nourished. He does not appear ill.  HENT:  Head: Normocephalic.  Mouth/Throat: Oropharynx is clear and moist.  Eyes: Pupils  are equal, round, and reactive to light. Conjunctivae are normal.  Neck: Neck supple.  Cardiovascular: Normal heart sounds.  Pulmonary/Chest: Breath sounds normal.  Abdominal: Soft. Normal appearance and bowel sounds are normal. There is no hepatosplenomegaly. There is tenderness in the epigastric area. There is no rigidity, no rebound, no guarding and no CVA tenderness.    Tenderness sub-xiphoid epigastric area, and mild tenderness to palpation right upper quadrant.  Murphy's sign negative.  Musculoskeletal: He  exhibits no edema.  Lymphadenopathy:    He has no cervical adenopathy.  Neurological: He is alert.  Skin: Skin is warm and dry.  Nursing note and vitals reviewed.    UC Treatments / Results  Labs (all labs ordered are listed, but only abnormal results are displayed) Labs Reviewed  AMYLASE  LIPASE  COMPLETE METABOLIC PANEL WITH GFR  C-REACTIVE PROTEIN  POCT CBC W AUTO DIFF ([facility]):  WBC 8.6; LY 19.6; MO 8.1; GR 72.3; Hgb 13.3; Platelets 167   POCT URINALYSIS DIP (MANUAL ENTRY) negative    EKG None Radiology No results found.  Procedures Procedures (including critical care time)  Medications Ordered in UC Medications - No data to display   Initial Impression / Assessment and Plan / UC Course  I have reviewed the triage vital signs and the nursing notes.  Pertinent labs & imaging results that were available during my care of the patient were reviewed by me and considered in my medical decision making (see chart for details).    ?recurrent mild pancreatitis.  Normal WBC, Hgb, and urinalysis reassuring. CRP, amylase, lipase, CMP pending. Rx for Lortab and Phenergan. Controlled Substance Prescriptions I have consulted the [place] Controlled Substances Registry for this patient, and feel the risk/benefit ratio today is favorable for proceeding with this prescription for a controlled substance.   Continue clear liquid diet.  May begin a [employer]  (Bananas, Rice, Applesauce, Toast) when nausea and abdominal pain resolved. Then gradually advance to a regular diet as tolerated.   If symptoms become significantly worse during the night or over the weekend, proceed to the local emergency room.  Followup with gastroenterologist.    Final Clinical Impressions(s) / UC Diagnoses   Final diagnoses:  Abdominal pain, epigastric  Nausea without vomiting    ED Discharge Orders        Ordered    promethazine (PHENERGAN) 25 MG tablet  Every 8 hours PRN     02/20/18 1825    HYDROcodone-acetaminophen (NORCO/VICODIN) 5-325 MG tablet  Every 6 hours PRN     02/20/18 1825          Lattie Haw, MD 02/22/18 [phone]

## 2018-02-21 ENCOUNTER — Telehealth: Payer: Self-pay | Admitting: Emergency Medicine

## 2018-02-21 LAB — COMPLETE METABOLIC PANEL WITH GFR
AG Ratio: 1.5 (calc) (ref 1.0–2.5)
ALT: 9 U/L (ref 9–46)
AST: 13 U/L (ref 10–40)
Albumin: 4 g/dL (ref 3.6–5.1)
Alkaline phosphatase (APISO): 56 U/L (ref 40–115)
BUN: 10 mg/dL (ref 7–25)
CO2: 29 mmol/L (ref 20–32)
Calcium: 8.9 mg/dL (ref 8.6–10.3)
Chloride: 103 mmol/L (ref 98–110)
Creat: 1.19 mg/dL (ref 0.60–1.35)
GFR, EST AFRICAN AMERICAN: 83 mL/min/{1.73_m2} (ref >=60)
GFR, EST NON AFRICAN AMERICAN: 71 mL/min/{1.73_m2} (ref >=60)
Globulin: 2.6 g/dL (calc) (ref 1.9–3.7)
Glucose, Bld: 86 mg/dL (ref 65–99)
POTASSIUM: 4.1 mmol/L (ref 3.5–5.3)
Sodium: 139 mmol/L (ref 135–146)
TOTAL PROTEIN: 6.6 g/dL (ref 6.1–8.1)
Total Bilirubin: 0.6 mg/dL (ref 0.2–1.2)

## 2018-02-21 LAB — AMYLASE: Amylase: 45 U/L (ref 21–101)

## 2018-02-21 LAB — C-REACTIVE PROTEIN: CRP: 150.1 mg/L — ABNORMAL HIGH (ref <8.0)

## 2018-02-21 LAB — LIPASE: Lipase: 73 U/L — ABNORMAL HIGH (ref 7–60)

## 2018-02-21 NOTE — Telephone Encounter (Signed)
Spoke with patient and he is still having some abdominal pain. Gave him lab test results along with Dr.Daub's suggestion that he be evaluated by his GI person as soon as possible for appropriate re-evaluation. Patient understands and will comply.

## 2018-03-21 ENCOUNTER — Encounter: Payer: Self-pay | Admitting: Family Medicine

## 2018-03-23 ENCOUNTER — Other Ambulatory Visit: Payer: Self-pay | Admitting: Family Medicine

## 2018-03-23 ENCOUNTER — Telehealth: Payer: Self-pay | Admitting: Family Medicine

## 2018-03-23 MED ORDER — VALSARTAN 40 MG PO TABS: 40.0000 mg | ORAL_TABLET | Freq: Every day | ORAL | 3 refills | Status: DC

## 2018-03-23 NOTE — Telephone Encounter (Signed)
Yes, please change the rx to  tabs, 1/2 to 1 tab daily for BP.  #90, 3rf. Thanks.

## 2018-03-23 NOTE — Telephone Encounter (Signed)
Becky advised. 

## 2018-03-23 NOTE — Telephone Encounter (Signed)
Copied from CRM [id number]. Topic: Quick Communication - See Telephone Encounter >> Mar 23, 2018  1:07 PM Windy Kalata, NT wrote: CRM for notification. See Telephone encounter for: 03/23/18.  Kriste Basque is calling from [facility] and states that valsartan (DIOVAN) 40 MG tablet requires a prior authorization, so therefore she would like to know is it okay to change it to the  tablet take a half to one a day. Please contact.  [phone]

## 2018-05-19 DIAGNOSIS — E291 Testicular hypofunction: Secondary | ICD-10-CM | POA: Diagnosis not present

## 2018-05-26 DIAGNOSIS — E291 Testicular hypofunction: Secondary | ICD-10-CM | POA: Diagnosis not present

## 2018-05-26 DIAGNOSIS — R5383 Other fatigue: Secondary | ICD-10-CM | POA: Diagnosis not present

## 2018-05-26 DIAGNOSIS — I10 Essential (primary) hypertension: Secondary | ICD-10-CM | POA: Diagnosis not present

## 2018-05-26 DIAGNOSIS — G4700 Insomnia, unspecified: Secondary | ICD-10-CM | POA: Diagnosis not present

## 2018-06-02 DIAGNOSIS — E291 Testicular hypofunction: Secondary | ICD-10-CM | POA: Diagnosis not present

## 2018-06-09 DIAGNOSIS — E291 Testicular hypofunction: Secondary | ICD-10-CM | POA: Diagnosis not present

## 2018-06-16 DIAGNOSIS — E291 Testicular hypofunction: Secondary | ICD-10-CM | POA: Diagnosis not present

## 2018-06-23 DIAGNOSIS — E291 Testicular hypofunction: Secondary | ICD-10-CM | POA: Diagnosis not present

## 2018-06-29 ENCOUNTER — Other Ambulatory Visit: Payer: Self-pay | Admitting: Family Medicine

## 2018-06-29 DIAGNOSIS — E291 Testicular hypofunction: Secondary | ICD-10-CM | POA: Diagnosis not present

## 2018-06-29 NOTE — Telephone Encounter (Signed)
Received faxed refill request for zolpidem (AMBIEN) 10 MG tablet  Last prescribed on 01/12/2018  #30 with 5 refills.  Last seen on 01/12/2018

## 2018-06-30 DIAGNOSIS — E291 Testicular hypofunction: Secondary | ICD-10-CM | POA: Diagnosis not present

## 2018-06-30 DIAGNOSIS — R5383 Other fatigue: Secondary | ICD-10-CM | POA: Diagnosis not present

## 2018-06-30 MED ORDER — ZOLPIDEM TARTRATE 10 MG PO TABS: 5.0000 mg | ORAL_TABLET | Freq: Every evening | ORAL | 5 refills | Status: DC | PRN

## 2018-06-30 NOTE — Telephone Encounter (Signed)
Medication phoned to pharmacy.  

## 2018-06-30 NOTE — Telephone Encounter (Signed)
Please call in.  My authentication program is not allowing me to send rx electronically at this point.  Thanks.  

## 2018-07-31 DIAGNOSIS — E291 Testicular hypofunction: Secondary | ICD-10-CM | POA: Diagnosis not present

## 2018-08-02 DIAGNOSIS — E291 Testicular hypofunction: Secondary | ICD-10-CM | POA: Diagnosis not present

## 2018-08-02 DIAGNOSIS — R5383 Other fatigue: Secondary | ICD-10-CM | POA: Diagnosis not present

## 2018-08-02 DIAGNOSIS — N529 Male erectile dysfunction, unspecified: Secondary | ICD-10-CM | POA: Diagnosis not present

## 2018-09-16 DIAGNOSIS — K8500 Idiopathic acute pancreatitis without necrosis or infection: Secondary | ICD-10-CM | POA: Diagnosis not present

## 2018-09-16 DIAGNOSIS — K5900 Constipation, unspecified: Secondary | ICD-10-CM | POA: Diagnosis not present

## 2018-09-16 DIAGNOSIS — Z79899 Other long term (current) drug therapy: Secondary | ICD-10-CM | POA: Diagnosis not present

## 2018-09-16 DIAGNOSIS — Z880 Allergy status to penicillin: Secondary | ICD-10-CM | POA: Diagnosis not present

## 2018-09-16 DIAGNOSIS — R000 Tachycardia, unspecified: Secondary | ICD-10-CM | POA: Diagnosis not present

## 2018-09-19 DIAGNOSIS — J9811 Atelectasis: Secondary | ICD-10-CM | POA: Diagnosis not present

## 2018-09-19 DIAGNOSIS — R0689 Other abnormalities of breathing: Secondary | ICD-10-CM | POA: Diagnosis not present

## 2018-09-19 DIAGNOSIS — R0989 Other specified symptoms and signs involving the circulatory and respiratory systems: Secondary | ICD-10-CM | POA: Diagnosis not present

## 2018-09-19 DIAGNOSIS — K859 Acute pancreatitis without necrosis or infection, unspecified: Secondary | ICD-10-CM | POA: Diagnosis not present

## 2018-09-19 DIAGNOSIS — K52831 Collagenous colitis: Secondary | ICD-10-CM | POA: Diagnosis not present

## 2018-09-19 DIAGNOSIS — R0600 Dyspnea, unspecified: Secondary | ICD-10-CM | POA: Diagnosis not present

## 2018-09-19 DIAGNOSIS — R918 Other nonspecific abnormal finding of lung field: Secondary | ICD-10-CM | POA: Diagnosis not present

## 2018-10-25 DIAGNOSIS — E291 Testicular hypofunction: Secondary | ICD-10-CM | POA: Diagnosis not present

## 2018-10-27 DIAGNOSIS — G4700 Insomnia, unspecified: Secondary | ICD-10-CM | POA: Diagnosis not present

## 2018-10-27 DIAGNOSIS — R5383 Other fatigue: Secondary | ICD-10-CM | POA: Diagnosis not present

## 2018-10-27 DIAGNOSIS — M549 Dorsalgia, unspecified: Secondary | ICD-10-CM | POA: Diagnosis not present

## 2018-10-27 DIAGNOSIS — E291 Testicular hypofunction: Secondary | ICD-10-CM | POA: Diagnosis not present

## 2018-11-23 ENCOUNTER — Encounter: Payer: Self-pay | Admitting: Family Medicine

## 2018-11-26 ENCOUNTER — Other Ambulatory Visit: Payer: Self-pay | Admitting: Family Medicine

## 2018-11-26 DIAGNOSIS — I10 Essential (primary) hypertension: Secondary | ICD-10-CM

## 2018-12-05 ENCOUNTER — Encounter: Payer: Self-pay | Admitting: Family Medicine

## 2018-12-05 ENCOUNTER — Ambulatory Visit (INDEPENDENT_AMBULATORY_CARE_PROVIDER_SITE_OTHER): Payer: BLUE CROSS/BLUE SHIELD | Admitting: Family Medicine

## 2018-12-05 VITALS — BP 136/90 | HR 94 | Temp 98.1°F | Ht 71.0 in

## 2018-12-05 DIAGNOSIS — R002 Palpitations: Secondary | ICD-10-CM

## 2018-12-05 DIAGNOSIS — E291 Testicular hypofunction: Secondary | ICD-10-CM

## 2018-12-05 DIAGNOSIS — I10 Essential (primary) hypertension: Secondary | ICD-10-CM

## 2018-12-05 DIAGNOSIS — R000 Tachycardia, unspecified: Secondary | ICD-10-CM

## 2018-12-05 MED ORDER — METOPROLOL TARTRATE 25 MG PO TABS: 12.5000 mg | ORAL_TABLET | Freq: Two times a day (BID) | ORAL | 1 refills | Status: DC | PRN

## 2018-12-05 MED ORDER — VALSARTAN 40 MG PO TABS: 80.0000 mg | ORAL_TABLET | Freq: Every day | ORAL | Status: DC

## 2018-12-05 NOTE — Patient Instructions (Addendum)
Please get me a copy of your outside labs.   I wouldn't take extra testosterone at this point.   Go to the lab on the way out.  We'll contact you with your lab report.  We make arrangements for referrals, extra imaging, and other appointments based on the urgency of the situation. Referrals are handled based on the clinical situation, not in the order that they are placed. If you do not see one of our referral coordinators on the way out of the clinic today, then you should expect a call in the next 1 to 2 weeks. We work diligently to process all referrals as quickly as possible.    Take care.  Glad to see you.   Use metoprolol if needed.

## 2018-12-05 NOTE — Progress Notes (Signed)
Hypertension:    Using medication without problems or lightheadedness: yes Chest pain with exertion:no Edema:no Short of breath:no He has occ sensation of palpitations, can last a few hours, with pulse up to 110 at rest.  No prev w/u.  Going on for months episodically.  Noted some days more than others.  No caffeine.  No palpitations currently.    He is working on diet and exercise per his report.   No snoring per patient report.  He hasn't had OSA eval.  He is working days now.  He has fatigue, saw outside clinic, was started on T replacement.  I didn't know about this until today.  Mgmt per outside clinic, I'll defer.  Palpitations predate T replacement.    PMH and SH reviewed  ROS: Per HPI unless specifically indicated in ROS section   Meds, vitals, and allergies reviewed.   ROS: Per HPI unless specifically indicated in ROS section   GEN: nad, alert and oriented HEENT: mucous membranes moist NECK: supple w/o LA CV: rrr. PULM: ctab, no inc wob ABD: soft, +bs EXT: no edema SKIN: no acute rash 17.5" neck.

## 2018-12-06 NOTE — Assessment & Plan Note (Signed)
I advised him not to take extra testosterone placement through the outside clinic.  I asked him to get me a copy of his outside labs. >25 minutes spent in face to face time with patient, >50% spent in counselling or coordination of care.

## 2018-12-06 NOTE — Assessment & Plan Note (Addendum)
He has been having episodic palpitations for months.  I was not aware of this until today.  He is started taking testosterone replacement in the meantime but that was after the onset of palpitations.  I still advised him not to take additional testosterone until all of his other issues were addressed.  He had multiple labs done outside of the clinic.  I asked him to get me a copy of those.  Refer to cardiology.  He sounds regular on exam currently.  He can use metoprolol as needed in the meantime.  Continue ARB.  Routine cautions given to patient.

## 2018-12-06 NOTE — Assessment & Plan Note (Addendum)
The question of him having sleep apnea remains.  He has a 17.5 inch neck.  He is at risk for sleep apnea.  I want him to see cardiology in the meantime.  He may end up needing a sleep study.  He was interested in checking an aldosterone level.  See notes on labs.  Refer to cardiology in the meantime, given his history of palpitations.  No change in meds at this point.  >25 minutes spent in face to face time with patient, >50% spent in counselling or coordination of care.

## 2018-12-10 LAB — ALDOSTERONE + RENIN ACTIVITY W/ RATIO
ALDO / PRA Ratio: 2.5 Ratio (ref 0.9–28.9)
Aldosterone: 4 ng/dL
Renin Activity: 1.57 ng/mL/h (ref 0.25–5.82)

## 2019-01-03 ENCOUNTER — Encounter: Payer: Self-pay | Admitting: Cardiovascular Disease

## 2019-01-03 ENCOUNTER — Ambulatory Visit (INDEPENDENT_AMBULATORY_CARE_PROVIDER_SITE_OTHER): Payer: BLUE CROSS/BLUE SHIELD | Admitting: Cardiovascular Disease

## 2019-01-03 DIAGNOSIS — R002 Palpitations: Secondary | ICD-10-CM | POA: Insufficient documentation

## 2019-01-03 DIAGNOSIS — I10 Essential (primary) hypertension: Secondary | ICD-10-CM | POA: Diagnosis not present

## 2019-01-03 DIAGNOSIS — R079 Chest pain, unspecified: Secondary | ICD-10-CM | POA: Diagnosis not present

## 2019-01-03 DIAGNOSIS — R0789 Other chest pain: Secondary | ICD-10-CM | POA: Diagnosis not present

## 2019-01-03 NOTE — Progress Notes (Signed)
01/03/2019 PARKS MATTALIANO   1966-02-05  [id number]  Primary Physician Joaquim Nam, MD Primary Cardiologist: Runell Gess MD Nicholes Calamity, [place]  HPI:  JAVYON BUTLER is a 53 y.o. moderately overweight married Caucasian male father of 1 son referred by Dr. Crawford Givens for cardiovascular valuation because of palpitations and atypical chest pain.  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  His wife Marcelino Duster was a PA in our practice working with Dr. Gala Romney.  He has no cardiac risk factors other than hypertension.  He does have idiopathic pancreatitis.  Is never had a heart attack or stroke.  There is no family history for heart disease.  He has had palpitations over the last year more frequent over the last several months occurring several times a week lasting up to 15 to 30 minutes at a time with occasional episodes of associated chest pain.  He has cut out caffeine without change in his symptoms.   Current Meds  Medication Sig  . aspirin 81 MG tablet Take 81 mg by mouth daily.  . cyclobenzaprine (FLEXERIL) 10 MG tablet Take 0.5-1 tablets (5-10 mg total) by mouth 3 (three) times daily as needed for muscle spasms (sedation caution).  . metoprolol tartrate (LOPRESSOR) 25 MG tablet Take 0.5-1 tablets (12.5-25 mg total) by mouth 2 (two) times daily as needed.  . Multiple Vitamin (MULTIVITAMIN) tablet Take 1 tablet by mouth daily.    . NONFORMULARY OR COMPOUNDED ITEM Testosterone replacement per outside clinic.  Marland Kitchen triamcinolone cream (KENALOG) 0.1 % Apply 1 application topically 2 (two) times daily as needed.  . valsartan (DIOVAN) 40 MG tablet Take 2 tablets (80 mg total) by mouth daily.  Marland Kitchen zolpidem (AMBIEN) 10 MG tablet Take 0.5-1 tablets (5-10 mg total) by mouth at bedtime as needed.     Allergies  Allergen Reactions  . Lisinopril Other (See Comments)    Throat irritation- not angioedema  . Penicillins Other (See Comments)    Unclear reaction; tolerates  cephalosporins and amoxil    Social History   Socioeconomic History  . Marital status: Married    Spouse name: Not on file  . Number of children: Not on file  . Years of education: Not on file  . Highest education level: Not on file  Occupational History  . Not on file  Social Needs  . Financial resource strain: Not on file  . Food insecurity:    Worry: Not on file    Inability: Not on file  . Transportation needs:    Medical: Not on file    Non-medical: Not on file  Tobacco Use  . Smoking status: Never Smoker  . Smokeless tobacco: Never Used  Substance and Sexual Activity  . Alcohol use: Yes    Alcohol/week: 0.0 standard drinks    Comment: very rare  . Drug use: No  . Sexual activity: Not on file  Lifestyle  . Physical activity:    Days per week: Not on file    Minutes per session: Not on file  . Stress: Not on file  Relationships  . Social connections:    Talks on phone: Not on file    Gets together: Not on file    Attends religious service: Not on file    Active member of club or organization: Not on file    Attends meetings of clubs or organizations: Not on file    Relationship status: Not on file  . Intimate  partner violence:    Fear of current or ex partner: Not on file    Emotionally abused: Not on file    Physically abused: Not on file    Forced sexual activity: Not on file  Other Topics Concern  . Not on file  Social History Narrative   PA at [employer]   Remarried 2002, 1 adult son from prev marriage     Review of Systems: General: negative for chills, fever, night sweats or weight changes.  Cardiovascular: negative for chest pain, dyspnea on exertion, edema, orthopnea, palpitations, paroxysmal nocturnal dyspnea or shortness of breath Dermatological: negative for rash Respiratory: negative for cough or wheezing Urologic: negative for hematuria Abdominal: negative for nausea, vomiting, diarrhea, bright red blood per rectum, melena, or  hematemesis Neurologic: negative for visual changes, syncope, or dizziness All other systems reviewed and are otherwise negative except as noted above.    Blood pressure (!) 150/108, pulse 88, height 5\' 11"  (1.803 m), weight 247 lb 6.4 oz (112.2 kg).  General appearance: alert and no distress Neck: no adenopathy, no carotid bruit, no JVD, supple, symmetrical, trachea midline and thyroid not enlarged, symmetric, no tenderness/mass/nodules Lungs: clear to auscultation bilaterally Heart: regular rate and rhythm, S1, S2 normal, no murmur, click, rub or gallop Extremities: extremities normal, atraumatic, no cyanosis or edema Pulses: 2+ and symmetric Skin: Skin color, texture, turgor normal. No rashes or lesions Neurologic: Alert and oriented X 3, normal strength and tone. Normal symmetric reflexes. Normal coordination and gait  EKG sinus rhythm at 88 with incomplete right bundle branch block.  I personally reviewed this EKG.  ASSESSMENT AND PLAN:   Essential hypertension History essentially potential blood pressure measured today at 150/108.  He is on metoprolol and valsartan.  He says his blood pressures been more difficult to control over the last year.  He avoids salt.  I am going to get a renal Doppler study to further evaluate.  Palpitations History of palpitations of last year more frequent over the last several months occurring several times a week lasting up to 30 minutes at a time.  Has had a couple episodes of chest pain with these.  We will put a two 2-week ZIO patch on to further evaluate  Atypical chest pain Mr Walkenhorst had several episodes of atypical chest pain associated with his tachypalpitations.  I am going to get an exercise Myoview stress test to further evaluate      Runell Gess MD [facility], [facility] 01/03/2019 1:01 PM

## 2019-01-03 NOTE — Assessment & Plan Note (Signed)
History of palpitations of last year more frequent over the last several months occurring several times a week lasting up to 30 minutes at a time.  Has had a couple episodes of chest pain with these.  We will put a two 2-week ZIO patch on to further evaluate

## 2019-01-03 NOTE — Assessment & Plan Note (Signed)
History essentially potential blood pressure measured today at 150/108.  He is on metoprolol and valsartan.  He says his blood pressures been more difficult to control over the last year.  He avoids salt.  I am going to get a renal Doppler study to further evaluate.

## 2019-01-03 NOTE — Patient Instructions (Signed)
Medication Instructions:  Your physician recommends that you continue on your current medications as directed. Please refer to the Current Medication list given to you today.  If you need a refill on your cardiac medications before your next appointment, please call your pharmacy.   Lab work: NONE If you have labs (blood work) drawn today and your tests are completely normal, you will receive your results only by: Marland Kitchen MyChart Message (if you have MyChart) OR . A paper copy in the mail If you have any lab test that is abnormal or we need to change your treatment, we will call you to review the results.  Testing/Procedures: Your physician has requested that you have an echocardiogram. Echocardiography is a painless test that uses sound waves to create images of your heart. It provides your doctor with information about the size and shape of your heart and how well your heart's chambers and valves are working. This procedure takes approximately one hour. There are no restrictions for this procedure. [address], [place], [place] [address]   Your physician has requested that you have an exercise stress myoview. For further information please visit [email or web address] Please follow instruction sheet, as given.  Your physician has requested that you have a renal artery duplex. During this test, an ultrasound is used to evaluate blood flow to the kidneys. Allow one hour for this exam. Do not eat after midnight the day before and avoid carbonated beverages. Take your medications as you usually do.  Your physician has recommended that you wear a 14 DAY ZIO-PATCH monitor. The Zio patch cardiac monitor continuously records heart rhythm data for up to 14 days, this is for patients being evaluated for multiple types heart rhythms. For the first 24 hours post application, please avoid getting the Zio monitor wet in the shower or by excessive sweating during exercise. After that, feel free to carry on  with regular activities. Keep soaps and lotions away from the ZIO XT Patch.  This will be placed at our [facility] location - [address].         Follow-Up: At [facility], you and your health needs are our priority.  As part of our continuing mission to provide you with exceptional heart care, we have created designated Provider Care Teams.  These Care Teams include your primary Cardiologist (physician) and Advanced Practice Providers (APPs -  Physician Assistants and Nurse Practitioners) who all work together to provide you with the care you need, when you need it. You will need a follow up appointment with to be scheduled with Dr. Allyson Sabal after your studies have been resulted.

## 2019-01-03 NOTE — Assessment & Plan Note (Signed)
Caleb [name] had several episodes of atypical chest pain associated with his tachypalpitations.  I am going to get an exercise Myoview stress test to further evaluate

## 2019-01-04 ENCOUNTER — Telehealth (HOSPITAL_COMMUNITY): Payer: Self-pay

## 2019-01-04 NOTE — Telephone Encounter (Signed)
Encounter complete. 

## 2019-01-05 ENCOUNTER — Ambulatory Visit (HOSPITAL_COMMUNITY)
Admission: RE | Admit: 2019-01-05 | Discharge: 2019-01-05 | Disposition: A | Discharge status: Home / Self Care | Payer: BLUE CROSS/BLUE SHIELD | Source: Ambulatory Visit | Attending: Cardiology | Admitting: Cardiology

## 2019-01-05 DIAGNOSIS — R079 Chest pain, unspecified: Secondary | ICD-10-CM | POA: Diagnosis not present

## 2019-01-05 LAB — MYOCARDIAL PERFUSION IMAGING
CSEPED: 10 min
Estimated workload: 12.7 METS
Exercise duration (sec): 35 s
LV dias vol: 89 mL (ref 62–150)
LV sys vol: 41 mL
MPHR: 170 {beats}/min
NUC STRESS TID: 0.87
Peak HR: 179 {beats}/min
Percent HR: 105 %
RPE: 19
Rest HR: 88 {beats}/min
SDS: 0
SRS: 0
SSS: 0

## 2019-01-05 MED ORDER — TECHNETIUM TC 99M TETROFOSMIN IV KIT
11.0000 | PACK | Freq: Once | INTRAVENOUS | Status: AC | PRN
  Administered 2019-01-05: 11 via INTRAVENOUS
  Filled 2019-01-05: qty 11

## 2019-01-05 MED ORDER — TECHNETIUM TC 99M TETROFOSMIN IV KIT
32.1000 | PACK | Freq: Once | INTRAVENOUS | Status: AC | PRN
  Administered 2019-01-05: 32.1 via INTRAVENOUS
  Filled 2019-01-05: qty 33

## 2019-01-08 ENCOUNTER — Other Ambulatory Visit: Payer: Self-pay | Admitting: *Deleted

## 2019-01-08 NOTE — Telephone Encounter (Signed)
Faxed refill request. Cyclobenzaprine Last office visit:   12/05/2018 Last Filled:     30 tablet 12 01/12/2018  Please advise.

## 2019-01-09 MED ORDER — CYCLOBENZAPRINE HCL 10 MG PO TABS: 5.0000 mg | ORAL_TABLET | Freq: Three times a day (TID) | ORAL | 12 refills | Status: DC | PRN

## 2019-01-09 NOTE — Telephone Encounter (Signed)
Sent. Thanks.   

## 2019-01-17 ENCOUNTER — Ambulatory Visit (HOSPITAL_COMMUNITY): Payer: BLUE CROSS/BLUE SHIELD | Attending: Cardiovascular Disease

## 2019-01-17 ENCOUNTER — Other Ambulatory Visit: Payer: Self-pay

## 2019-01-17 DIAGNOSIS — R079 Chest pain, unspecified: Secondary | ICD-10-CM | POA: Insufficient documentation

## 2019-01-17 DIAGNOSIS — R0789 Other chest pain: Secondary | ICD-10-CM | POA: Diagnosis not present

## 2019-01-17 DIAGNOSIS — H524 Presbyopia: Secondary | ICD-10-CM | POA: Diagnosis not present

## 2019-01-18 ENCOUNTER — Other Ambulatory Visit: Payer: Self-pay

## 2019-01-18 ENCOUNTER — Ambulatory Visit (INDEPENDENT_AMBULATORY_CARE_PROVIDER_SITE_OTHER): Payer: BLUE CROSS/BLUE SHIELD | Admitting: Family Medicine

## 2019-01-18 DIAGNOSIS — Z0000 Encounter for general adult medical examination without abnormal findings: Secondary | ICD-10-CM | POA: Diagnosis not present

## 2019-01-18 DIAGNOSIS — I10 Essential (primary) hypertension: Secondary | ICD-10-CM | POA: Diagnosis not present

## 2019-01-18 LAB — BASIC METABOLIC PANEL
BUN: 18 mg/dL (ref 6–23)
CHLORIDE: 102 meq/L (ref 96–112)
CO2: 30 meq/L (ref 19–32)
Calcium: 9.1 mg/dL (ref 8.4–10.5)
Creatinine, Ser: 1.2 mg/dL (ref 0.40–1.50)
GFR: 64.03 mL/min (ref >60.00)
Glucose, Bld: 88 mg/dL (ref 70–99)
Potassium: 4.1 mEq/L (ref 3.5–5.1)
Sodium: 139 mEq/L (ref 135–145)

## 2019-01-18 LAB — LIPID PANEL
Cholesterol: 166 mg/dL (ref 0–200)
HDL: 39.5 mg/dL (ref >39.00)
LDL Cholesterol: 94 mg/dL (ref 0–99)
NonHDL: 126.79
Total CHOL/HDL Ratio: 4
Triglycerides: 163 mg/dL — ABNORMAL HIGH (ref 0.0–149.0)
VLDL: 32.6 mg/dL (ref 0.0–40.0)

## 2019-01-22 NOTE — Progress Notes (Signed)
See scanned copy.  EMR done at [facility].

## 2019-01-22 NOTE — Assessment & Plan Note (Signed)
See scanned copy

## 2019-01-30 ENCOUNTER — Ambulatory Visit (INDEPENDENT_AMBULATORY_CARE_PROVIDER_SITE_OTHER): Payer: BLUE CROSS/BLUE SHIELD

## 2019-01-30 ENCOUNTER — Ambulatory Visit (HOSPITAL_COMMUNITY): Payer: BLUE CROSS/BLUE SHIELD

## 2019-01-30 DIAGNOSIS — R002 Palpitations: Secondary | ICD-10-CM | POA: Diagnosis not present

## 2019-02-01 ENCOUNTER — Other Ambulatory Visit: Payer: Self-pay | Admitting: Family Medicine

## 2019-02-13 ENCOUNTER — Ambulatory Visit: Payer: BLUE CROSS/BLUE SHIELD | Admitting: Cardiovascular Disease

## 2019-02-21 DIAGNOSIS — R002 Palpitations: Secondary | ICD-10-CM | POA: Diagnosis not present

## 2019-02-22 ENCOUNTER — Other Ambulatory Visit: Payer: Self-pay

## 2019-02-27 ENCOUNTER — Encounter (HOSPITAL_COMMUNITY): Payer: BLUE CROSS/BLUE SHIELD

## 2019-03-02 ENCOUNTER — Encounter (HOSPITAL_COMMUNITY): Payer: BLUE CROSS/BLUE SHIELD

## 2019-03-07 ENCOUNTER — Ambulatory Visit: Payer: BLUE CROSS/BLUE SHIELD | Admitting: Cardiovascular Disease

## 2019-03-09 ENCOUNTER — Other Ambulatory Visit: Payer: Self-pay | Admitting: Family Medicine

## 2019-03-09 NOTE — Telephone Encounter (Signed)
Electronic refill request for Ambien Last refill 06/30/18 #30/5 Last office visit 01/18/19

## 2019-03-11 NOTE — Telephone Encounter (Signed)
Sent. Thanks.   

## 2019-03-13 ENCOUNTER — Other Ambulatory Visit: Payer: Self-pay | Admitting: *Deleted

## 2019-03-13 NOTE — Telephone Encounter (Signed)
Last office visit 01/18/2019 for CPE.  Last refilled 01/12/2018 for 45 g with 12 refills.  No future appointments.  Ok to refill?

## 2019-03-14 MED ORDER — TRIAMCINOLONE ACETONIDE 0.1 % EX CREA: 1.0000 "application " | TOPICAL_CREAM | Freq: Two times a day (BID) | CUTANEOUS | 12 refills | Status: DC | PRN

## 2019-03-14 NOTE — Telephone Encounter (Signed)
Sent. Thanks.   

## 2019-03-28 DIAGNOSIS — E291 Testicular hypofunction: Secondary | ICD-10-CM | POA: Diagnosis not present

## 2019-04-06 ENCOUNTER — Encounter (HOSPITAL_COMMUNITY): Payer: BLUE CROSS/BLUE SHIELD

## 2019-04-11 DIAGNOSIS — E291 Testicular hypofunction: Secondary | ICD-10-CM | POA: Diagnosis not present

## 2019-04-11 DIAGNOSIS — N529 Male erectile dysfunction, unspecified: Secondary | ICD-10-CM | POA: Diagnosis not present

## 2019-04-11 DIAGNOSIS — R5383 Other fatigue: Secondary | ICD-10-CM | POA: Diagnosis not present

## 2019-04-26 ENCOUNTER — Ambulatory Visit (HOSPITAL_COMMUNITY)
Admission: RE | Admit: 2019-04-26 | Discharge: 2019-04-26 | Disposition: A | Discharge status: Home / Self Care | Payer: BC Managed Care – PPO | Source: Ambulatory Visit | Attending: Internal Medicine | Admitting: Internal Medicine

## 2019-04-26 ENCOUNTER — Other Ambulatory Visit: Payer: Self-pay

## 2019-04-26 DIAGNOSIS — I10 Essential (primary) hypertension: Secondary | ICD-10-CM

## 2019-04-30 ENCOUNTER — Telehealth: Payer: Self-pay

## 2019-04-30 NOTE — Telephone Encounter (Signed)
Spoke with pt and informed that Dr. Gwenlyn Found will be in office on 7/1 if he would like to change appointment back to regular [facility]. Pt is agreeable.

## 2019-05-09 ENCOUNTER — Other Ambulatory Visit: Payer: Self-pay

## 2019-05-09 ENCOUNTER — Ambulatory Visit (INDEPENDENT_AMBULATORY_CARE_PROVIDER_SITE_OTHER): Payer: BC Managed Care – PPO | Admitting: Cardiovascular Disease

## 2019-05-09 ENCOUNTER — Encounter: Payer: Self-pay | Admitting: Cardiovascular Disease

## 2019-05-09 DIAGNOSIS — R002 Palpitations: Secondary | ICD-10-CM

## 2019-05-09 DIAGNOSIS — R0789 Other chest pain: Secondary | ICD-10-CM | POA: Diagnosis not present

## 2019-05-09 DIAGNOSIS — I10 Essential (primary) hypertension: Secondary | ICD-10-CM

## 2019-05-09 MED ORDER — DILTIAZEM HCL ER COATED BEADS 120 MG PO CP24: 120.0000 mg | ORAL_CAPSULE | Freq: Every day | ORAL | 3 refills | Status: DC

## 2019-05-09 NOTE — Assessment & Plan Note (Signed)
History of palpitations with recent event monitor showing sinus rhythm, sinus bradycardia, sinus tachycardia with occasional PVCs.  I am going to begin him on Cardizem CD 120 mg a day for heart rate as well as blood pressure

## 2019-05-09 NOTE — Assessment & Plan Note (Signed)
History of atypical chest pain usually occurring during episodes of chest pain with recent Myoview stress test and 2D echo all which were normal.

## 2019-05-09 NOTE — Patient Instructions (Addendum)
Medication Instructions:  Your physician has recommended you make the following change in your medication:   START DILTIAZEM (CARDIZEM CD) 120 MG BY MOUTH DAILY  If you need a refill on your cardiac medications before your next appointment, please call your pharmacy.   Lab work: NONE If you have labs (blood work) drawn today and your tests are completely normal, you will receive your results only by: Marland Kitchen MyChart Message (if you have MyChart) OR . A paper copy in the mail If you have any lab test that is abnormal or we need to change your treatment, we will call you to review the results.  Testing/Procedures: NONE  Follow-Up: At [facility], you and your health needs are our priority.  As part of our continuing mission to provide you with exceptional heart care, we have created designated Provider Care Teams.  These Care Teams include your primary Cardiologist (physician) and Advanced Practice Providers (APPs -  Physician Assistants and Nurse Practitioners) who all work together to provide you with the care you need, when you need it. . You will need a follow up appointment in 6 months WITH DR. Gwenlyn Found.  Please call our office 2 months in advance to schedule this appointment.    Any Other Special Instructions Will Be Listed Below (If Applicable). KEEP A DAILY BLOOD PRESSURE LOG FOR 30 DAYS THEN FOLLOW UP WITH A CLINICAL PHARMACIST IN THE HYPERTENSION CLINIC. THIS MAY BE DONE IN THE OFFICE OR VIRTUALLY.

## 2019-05-09 NOTE — Assessment & Plan Note (Signed)
History of essential hypertension on valsartan blood pressure measured today at 124/90.  His diastolics have been on the borderline.  Going to add Cardizem CD 120 mg a day with addressing perceived tachycardia and mild diastolic hypertension.  We will have him keep a blood pressure log for the next 4 weeks and with Cyril Mourning will call to follow-up and make adjustments

## 2019-05-09 NOTE — Progress Notes (Signed)
05/09/2019 Caleb [name]   July 29, 1966  [id number]  Primary Physician Joaquim Namuncan, Graham S, MD Primary Cardiologist: Runell GessJonathan J Hermelinda Diegel MD Nicholes CalamityFACP, FACC, FAHA, [place]  HPI:  Caleb [name] is a 53 y.o.  moderately overweight married Caucasian male father of 1 son referred by Dr. Crawford GivensGraham Duncan for cardiovascular valuation because of palpitations and atypical chest pain.  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  His wife Marcelino DusterMichelle was a PA in our practice working with Dr. Gala RomneyBensimhon .  He has no cardiac risk factors other than hypertension.  He does have idiopathic pancreatitis.  Is never had a heart attack or stroke.  There is no family history for heart disease.  He has had palpitations over the last year more frequent over the last several months occurring several times a week lasting up to 15 to 30 minutes at a time with occasional episodes of associated chest pain.  He has cut out caffeine without change in his symptoms.  Since last saw him 4 months ago he has had a 2D echo which was normal, exercise Myoview stress test which was normal, event monitor that showed occasional PVCs with sinus rhythm and normal renal Doppler studies.  He continues to have an elevated resting sinus heart rate and occasional palpitations.   Current Meds  Medication Sig  . aspirin 81 MG tablet Take 81 mg by mouth daily.  . cyclobenzaprine (FLEXERIL) 10 MG tablet Take 0.5-1 tablets (5-10 mg total) by mouth 3 (three) times daily as needed for muscle spasms (sedation caution).  . metoprolol tartrate (LOPRESSOR) 25 MG tablet Take 0.5-1 tablets (12.5-25 mg total) by mouth 2 (two) times daily as needed.  . Multiple Vitamin (MULTIVITAMIN) tablet Take 1 tablet by mouth daily.    . NONFORMULARY OR COMPOUNDED ITEM Testosterone replacement per outside clinic.  Marland Kitchen. triamcinolone cream (KENALOG) 0.1 % Apply 1 application topically 2 (two) times daily as needed.  . valsartan (DIOVAN) 80 MG tablet TAKE 1/2 TO 1 TABLET BY  MOUTH EVERY DAY  . zolpidem (AMBIEN) 10 MG tablet TAKE 1/2 TO 1 TABLET BY MOUTH AT BEDTIME AS NEEDED     Allergies  Allergen Reactions  . Lisinopril Other (See Comments)    Throat irritation- not angioedema  . Penicillins Other (See Comments)    Unclear reaction; tolerates cephalosporins and amoxil    Social History   Socioeconomic History  . Marital status: Married    Spouse name: Not on file  . Number of children: Not on file  . Years of education: Not on file  . Highest education level: Not on file  Occupational History  . Not on file  Social Needs  . Financial resource strain: Not on file  . Food insecurity    Worry: Not on file    Inability: Not on file  . Transportation needs    Medical: Not on file    Non-medical: Not on file  Tobacco Use  . Smoking status: Never Smoker  . Smokeless tobacco: Never Used  Substance and Sexual Activity  . Alcohol use: Yes    Alcohol/week: 0.0 standard drinks    Comment: very rare  . Drug use: No  . Sexual activity: Not on file  Lifestyle  . Physical activity    Days per week: Not on file    Minutes per session: Not on file  . Stress: Not on file  Relationships  . Social connections    Talks on phone: Not on  file    Gets together: Not on file    Attends religious service: Not on file    Active member of club or organization: Not on file    Attends meetings of clubs or organizations: Not on file    Relationship status: Not on file  . Intimate partner violence    Fear of current or ex partner: Not on file    Emotionally abused: Not on file    Physically abused: Not on file    Forced sexual activity: Not on file  Other Topics Concern  . Not on file  Social History Narrative   PA at [employer]   Remarried 2002, 1 adult son from prev marriage     Review of Systems: General: negative for chills, fever, night sweats or weight changes.  Cardiovascular: negative for chest pain, dyspnea on exertion, edema, orthopnea,  palpitations, paroxysmal nocturnal dyspnea or shortness of breath Dermatological: negative for rash Respiratory: negative for cough or wheezing Urologic: negative for hematuria Abdominal: negative for nausea, vomiting, diarrhea, bright red blood per rectum, melena, or hematemesis Neurologic: negative for visual changes, syncope, or dizziness All other systems reviewed and are otherwise negative except as noted above.    Blood pressure 124/90, pulse 93, temperature (!) 97.5 F (36.4 C), height 5\' 11"  (1.803 m), weight 257 lb (116.6 kg).  General appearance: alert and no distress Neck: no adenopathy, no carotid bruit, no JVD, supple, symmetrical, trachea midline and thyroid not enlarged, symmetric, no tenderness/mass/nodules Lungs: clear to auscultation bilaterally Heart: regular rate and rhythm, S1, S2 normal, no murmur, click, rub or gallop Extremities: extremities normal, atraumatic, no cyanosis or edema Pulses: 2+ and symmetric Skin: Skin color, texture, turgor normal. No rashes or lesions Neurologic: Alert and oriented X 3, normal strength and tone. Normal symmetric reflexes. Normal coordination and gait  EKG not performed today  ASSESSMENT AND PLAN:   Essential hypertension History of essential hypertension on valsartan blood pressure measured today at 124/90.  His diastolics have been on the borderline.  Going to add Cardizem CD 120 mg a day with addressing perceived tachycardia and mild diastolic hypertension.  We will have him keep a blood pressure log for the next 4 weeks and with Cyril Mourning will call to follow-up and make adjustments  Palpitations History of palpitations with recent event monitor showing sinus rhythm, sinus bradycardia, sinus tachycardia with occasional PVCs.  I am going to begin him on Cardizem CD 120 mg a day for heart rate as well as blood pressure  Atypical chest pain History of atypical chest pain usually occurring during episodes of chest pain with recent  Myoview stress test and 2D echo all which were normal.      Lorretta Harp MD [facility], [facility] 05/09/2019 11:02 AM

## 2019-05-28 ENCOUNTER — Other Ambulatory Visit: Payer: Self-pay | Admitting: Family Medicine

## 2019-06-08 ENCOUNTER — Telehealth: Payer: Self-pay

## 2019-06-08 NOTE — Telephone Encounter (Signed)

## 2019-06-11 ENCOUNTER — Ambulatory Visit (INDEPENDENT_AMBULATORY_CARE_PROVIDER_SITE_OTHER): Payer: BC Managed Care – PPO | Admitting: Pharmacist

## 2019-06-11 ENCOUNTER — Other Ambulatory Visit: Payer: Self-pay

## 2019-06-11 VITALS — BP 130/88 | HR 93 | Resp 16 | Ht 71.0 in | Wt 257.0 lb

## 2019-06-11 DIAGNOSIS — R002 Palpitations: Secondary | ICD-10-CM | POA: Diagnosis not present

## 2019-06-11 DIAGNOSIS — I10 Essential (primary) hypertension: Secondary | ICD-10-CM

## 2019-06-11 MED ORDER — DILTIAZEM HCL ER 60 MG PO CP12: 60.0000 mg | ORAL_CAPSULE | Freq: Every evening | ORAL | 1 refills | Status: DC

## 2019-06-11 MED ORDER — VALSARTAN 80 MG PO TABS: 80.0000 mg | ORAL_TABLET | Freq: Every day | ORAL | 3 refills | Status: DC

## 2019-06-11 NOTE — Progress Notes (Signed)
Patient ID: Caleb [name]                 DOB: 08/11/1966                      MRN: [id number]     HPI: Caleb [name] is a 53 y.o. male referred by Dr. Allyson SabalBerry to HTN clinic. 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 His PMH includes hypertension, palpitations (occasional PVC's), and atypical chest pain.   Patient presents to clinic today in good spirit and denies problems with dizziness, chest pain, swelling, headaches or blurry vision. Only complain at this moment is palpitations in the evening and slightly elevated diastolic blood pressure. Patient will like to avoid use of beta-blocker for now due to ED potential.  Current HTN meds:  Diltiazem 120mg  daily - in the evening Metoprolol tartrate 12.5-25mg  BID PRN for palpitation Valsartan 80mg  daily  Previously tried:  Lisinopril - throat irritation (not angioedema)  BP goal: <130/80  Family History:   Social History: never smoker, occasional alcohol inatake  Diet: cut back caffeine, mainly home cooked melas but eats lots of BP sandwiches  Exercise: maintenance for his 10 acres of land   Home BP readings:  15 readings; average 135/86; HR range 82 to 115bpm  Wt Readings from Last 3 Encounters:  06/11/19 257 lb (116.6 kg)  05/09/19 257 lb (116.6 kg)  01/05/19 247 lb (112 kg)   BP Readings from Last 3 Encounters:  06/11/19 130/88  05/09/19 124/90  01/03/19 (!) 150/108   Pulse Readings from Last 3 Encounters:  06/11/19 93  05/09/19 93  01/03/19 88    Past Medical History:  Diagnosis Date  . Atopy    in wintertime on legs  . Collagenous colitis   . Insomnia    due to work cycle at [employer]  . Nephrolithiasis   . Pancreatitis    no clear source identified, mult episodes    Current Outpatient Medications on File Prior to Visit  Medication Sig Dispense Refill  . aspirin 81 MG tablet Take 81 mg by mouth daily.    . cyclobenzaprine (FLEXERIL) 10 MG tablet Take 0.5-1 tablets (5-10 mg total) by mouth 3 (three) times  daily as needed for muscle spasms (sedation caution). 30 tablet 12  . diltiazem (CARDIZEM CD) 120 MG 24 hr capsule Take 1 capsule (120 mg total) by mouth daily. 90 capsule 3  . metoprolol tartrate (LOPRESSOR) 25 MG tablet Take 1/2-1 tablets (12.5-25 mg total) by mouth 2 (two) times daily as needed. 180 tablet 1  . Multiple Vitamin (MULTIVITAMIN) tablet Take 1 tablet by mouth daily.      . NONFORMULARY OR COMPOUNDED ITEM Testosterone replacement per outside clinic.    Marland Kitchen. triamcinolone cream (KENALOG) 0.1 % Apply 1 application topically 2 (two) times daily as needed. 45 g 12  . zolpidem (AMBIEN) 10 MG tablet TAKE 1/2 TO 1 TABLET BY MOUTH AT BEDTIME AS NEEDED 30 tablet 5   No current facility-administered medications on file prior to visit.     Allergies  Allergen Reactions  . Lisinopril Other (See Comments)    Throat irritation- not angioedema  . Penicillins Other (See Comments)    Unclear reaction; tolerates cephalosporins and amoxil    Blood pressure 130/88, pulse 93, resp. rate 16, height 5\' 11"  (1.803 m), weight 257 lb (116.6 kg), SpO2 96 %.  Essential hypertension Blood pressure remains slightly above goal, but patient's main complaint continues to  be symptomatic palpitations causing problems to initiate sleep.   Will increase diltiazem to 180mg  daily to take at suppertime instead of bedtime. This should improve HR and blood pressure management. Once palpitations are under control, we can optimize valsartan dose as needed for BP management.  Palpitations Will increase diltiazem to 180mg  every evening and follow up by phone in 30 days. Will continue to titrate diltiazem to proper response and continue close monitoring as needed.     Trayson Stitely Rodriguez-Guzman PharmD, BCPS, [place] [address] 06/13/2019 12:04 PM

## 2019-06-11 NOTE — Patient Instructions (Signed)
Return for a  follow up appointment in 4 weeks (by phone)  Check your blood pressure at home daily (if able) and keep record of the readings.  Take your BP meds as follows: *INCREASE diltiazem to 180mg  every evening* (Take 120mg  plus 60mg  tablet )   Bring all of your meds, your BP cuff and your record of home blood pressures to your next appointment.  Exercise as you're able, try to walk approximately 30 minutes per day.  Keep salt intake to a minimum, especially watch canned and prepared boxed foods.  Eat more fresh fruits and vegetables and fewer canned items.  Avoid eating in fast food restaurants.    HOW TO TAKE YOUR BLOOD PRESSURE: . Rest 5 minutes before taking your blood pressure. .  Don't smoke or drink caffeinated beverages for at least 30 minutes before. . Take your blood pressure before (not after) you eat. . Sit comfortably with your back supported and both feet on the floor (don't cross your legs). . Elevate your arm to heart level on a table or a desk. . Use the proper sized cuff. It should fit smoothly and snugly around your bare upper arm. There should be enough room to slip a fingertip under the cuff. The bottom edge of the cuff should be 1 inch above the crease of the elbow. . Ideally, take 3 measurements at one sitting and record the average.

## 2019-06-13 NOTE — Assessment & Plan Note (Signed)
Blood pressure remains slightly above goal, but patient's main complaint continues to be symptomatic palpitations causing problems to initiate sleep.   Will increase diltiazem to 180mg  daily to take at suppertime instead of bedtime. This should improve HR and blood pressure management. Once palpitations are under control, we can optimize valsartan dose as needed for BP management.

## 2019-06-13 NOTE — Assessment & Plan Note (Signed)
Will increase diltiazem to 180mg  every evening and follow up by phone in 30 days. Will continue to titrate diltiazem to proper response and continue close monitoring as needed.

## 2019-07-05 ENCOUNTER — Telehealth: Payer: Self-pay | Admitting: Pharmacist

## 2019-07-05 NOTE — Telephone Encounter (Signed)
-----   Message from [place], [facility] sent at 06/13/2019 12:05 PM EDT ----- Regarding: BP and palpitations Diltiazem 180mg  daily. How is BP doing? Palpitations under control? S/sx?

## 2019-07-05 NOTE — Telephone Encounter (Signed)
LMOM; patient to call back for further assessment and Diltiazem dose adjustment if needed.

## 2019-07-06 MED ORDER — DILTIAZEM HCL ER COATED BEADS 240 MG PO CP24: 240.0000 mg | ORAL_CAPSULE | Freq: Every day | ORAL | 1 refills | Status: DC

## 2019-07-06 NOTE — Addendum Note (Signed)
Addended by: Harrington Challenger on: 07/06/2019 07:45 AM   Modules accepted: Orders

## 2019-07-06 NOTE — Telephone Encounter (Signed)
Patient called back today at 7:30am. Reports BP range 786-767M systolic and 09-470 diastolic. HR improved to average 80s. He denies problems with medication or ADR.   Will increase diltiazem to 240mg  daily and follow up in 2 months.

## 2019-07-20 ENCOUNTER — Telehealth: Payer: Self-pay | Admitting: Cardiovascular Disease

## 2019-07-20 DIAGNOSIS — K621 Rectal polyp: Secondary | ICD-10-CM | POA: Diagnosis not present

## 2019-07-20 DIAGNOSIS — Z1211 Encounter for screening for malignant neoplasm of colon: Secondary | ICD-10-CM | POA: Diagnosis not present

## 2019-07-20 DIAGNOSIS — D125 Benign neoplasm of sigmoid colon: Secondary | ICD-10-CM | POA: Diagnosis not present

## 2019-07-20 DIAGNOSIS — D120 Benign neoplasm of cecum: Secondary | ICD-10-CM | POA: Diagnosis not present

## 2019-07-20 LAB — HM COLONOSCOPY

## 2019-07-20 MED ORDER — DILTIAZEM HCL ER COATED BEADS 240 MG PO CP24: 240.0000 mg | ORAL_CAPSULE | Freq: Every day | ORAL | 1 refills | Status: DC

## 2019-07-20 NOTE — Telephone Encounter (Signed)
Pharmacy called stating they need new script sent for the increase of diltiazem (CARDIZEM CD) 240 MG 24 hr capsule

## 2019-07-20 NOTE — Telephone Encounter (Signed)
New Rx sent to the requesting pharmacy.  

## 2019-07-28 ENCOUNTER — Other Ambulatory Visit: Payer: Self-pay | Admitting: Cardiovascular Disease

## 2019-08-10 ENCOUNTER — Encounter: Payer: Self-pay | Admitting: Gastroenterology

## 2019-08-24 ENCOUNTER — Other Ambulatory Visit: Payer: Self-pay | Admitting: Family Medicine

## 2019-08-24 NOTE — Telephone Encounter (Signed)
Name of Medication: Zolpidem [facility] of [facility] Date and Quantity: 03/11/2019 #30 with 5 refills Last Office Visit and Type: 01/18/2019 CPE Next Office Visit and Type: none Last Controlled Substance Agreement Date: none Last UDS: none

## 2019-08-26 NOTE — Telephone Encounter (Signed)
Sent. Thanks.   

## 2019-09-21 ENCOUNTER — Telehealth: Payer: Self-pay | Admitting: Pharmacist

## 2019-09-21 MED ORDER — VALSARTAN 80 MG PO TABS: 120.0000 mg | ORAL_TABLET | Freq: Every day | ORAL | 3 refills | Status: DC

## 2019-09-21 NOTE — Telephone Encounter (Signed)
Patient still experiencing some palpitation at night but taking metoprolol only as needed.   BP 130-140. Denies dizziness, swelling or ADR.  He sill following up with DR Damita Dunnings in January for his BP management.   Recommendation:  1. Continue diltiazem and metoprolol as prescribed.  2. Increase valsartan to 1.5 tab daily (goal 160mg  but titrating slowly)  3. Follow up with DR Damita Dunnings in January and call back if as needed

## 2019-10-08 DIAGNOSIS — R5383 Other fatigue: Secondary | ICD-10-CM | POA: Diagnosis not present

## 2019-10-08 DIAGNOSIS — N529 Male erectile dysfunction, unspecified: Secondary | ICD-10-CM | POA: Diagnosis not present

## 2019-10-08 DIAGNOSIS — E291 Testicular hypofunction: Secondary | ICD-10-CM | POA: Diagnosis not present

## 2019-10-12 DIAGNOSIS — M2559 Pain in other specified joint: Secondary | ICD-10-CM | POA: Diagnosis not present

## 2019-10-12 DIAGNOSIS — E291 Testicular hypofunction: Secondary | ICD-10-CM | POA: Diagnosis not present

## 2019-10-12 DIAGNOSIS — N529 Male erectile dysfunction, unspecified: Secondary | ICD-10-CM | POA: Diagnosis not present

## 2019-11-28 DIAGNOSIS — M545 Low back pain: Secondary | ICD-10-CM | POA: Diagnosis not present

## 2019-11-28 DIAGNOSIS — M542 Cervicalgia: Secondary | ICD-10-CM | POA: Diagnosis not present

## 2019-11-28 DIAGNOSIS — M62830 Muscle spasm of back: Secondary | ICD-10-CM | POA: Diagnosis not present

## 2019-12-11 DIAGNOSIS — M545 Low back pain: Secondary | ICD-10-CM | POA: Diagnosis not present

## 2019-12-11 DIAGNOSIS — M62830 Muscle spasm of back: Secondary | ICD-10-CM | POA: Diagnosis not present

## 2019-12-11 DIAGNOSIS — M542 Cervicalgia: Secondary | ICD-10-CM | POA: Diagnosis not present

## 2019-12-12 DIAGNOSIS — M542 Cervicalgia: Secondary | ICD-10-CM | POA: Diagnosis not present

## 2019-12-12 DIAGNOSIS — M545 Low back pain: Secondary | ICD-10-CM | POA: Diagnosis not present

## 2019-12-12 DIAGNOSIS — M62830 Muscle spasm of back: Secondary | ICD-10-CM | POA: Diagnosis not present

## 2019-12-24 DIAGNOSIS — M542 Cervicalgia: Secondary | ICD-10-CM | POA: Diagnosis not present

## 2019-12-24 DIAGNOSIS — M545 Low back pain: Secondary | ICD-10-CM | POA: Diagnosis not present

## 2019-12-24 DIAGNOSIS — M62830 Muscle spasm of back: Secondary | ICD-10-CM | POA: Diagnosis not present

## 2019-12-28 DIAGNOSIS — M62830 Muscle spasm of back: Secondary | ICD-10-CM | POA: Diagnosis not present

## 2019-12-28 DIAGNOSIS — M545 Low back pain: Secondary | ICD-10-CM | POA: Diagnosis not present

## 2019-12-28 DIAGNOSIS — M542 Cervicalgia: Secondary | ICD-10-CM | POA: Diagnosis not present

## 2020-01-02 ENCOUNTER — Other Ambulatory Visit: Payer: Self-pay | Admitting: Family Medicine

## 2020-01-02 DIAGNOSIS — I10 Essential (primary) hypertension: Secondary | ICD-10-CM

## 2020-01-03 ENCOUNTER — Other Ambulatory Visit: Payer: Self-pay | Admitting: Cardiovascular Disease

## 2020-01-08 DIAGNOSIS — M62830 Muscle spasm of back: Secondary | ICD-10-CM | POA: Diagnosis not present

## 2020-01-08 DIAGNOSIS — M545 Low back pain: Secondary | ICD-10-CM | POA: Diagnosis not present

## 2020-01-08 DIAGNOSIS — M542 Cervicalgia: Secondary | ICD-10-CM | POA: Diagnosis not present

## 2020-01-11 ENCOUNTER — Other Ambulatory Visit: Payer: Self-pay

## 2020-01-11 ENCOUNTER — Other Ambulatory Visit (INDEPENDENT_AMBULATORY_CARE_PROVIDER_SITE_OTHER): Payer: BC Managed Care – PPO

## 2020-01-11 DIAGNOSIS — M62830 Muscle spasm of back: Secondary | ICD-10-CM | POA: Diagnosis not present

## 2020-01-11 DIAGNOSIS — I10 Essential (primary) hypertension: Secondary | ICD-10-CM | POA: Diagnosis not present

## 2020-01-11 DIAGNOSIS — M545 Low back pain: Secondary | ICD-10-CM | POA: Diagnosis not present

## 2020-01-11 DIAGNOSIS — M542 Cervicalgia: Secondary | ICD-10-CM | POA: Diagnosis not present

## 2020-01-11 LAB — COMPREHENSIVE METABOLIC PANEL
ALT: 15 U/L (ref 0–53)
AST: 14 U/L (ref 0–37)
Albumin: 4.2 g/dL (ref 3.5–5.2)
Alkaline Phosphatase: 61 U/L (ref 39–117)
BUN: 19 mg/dL (ref 6–23)
CO2: 29 mEq/L (ref 19–32)
Calcium: 9.9 mg/dL (ref 8.4–10.5)
Chloride: 103 mEq/L (ref 96–112)
Creatinine, Ser: 1.32 mg/dL (ref 0.40–1.50)
GFR: 57.14 mL/min — ABNORMAL LOW (ref >60.00)
Glucose, Bld: 96 mg/dL (ref 70–99)
Potassium: 4.6 mEq/L (ref 3.5–5.1)
Sodium: 139 mEq/L (ref 135–145)
Total Bilirubin: 0.7 mg/dL (ref 0.2–1.2)
Total Protein: 7.1 g/dL (ref 6.0–8.3)

## 2020-01-11 LAB — LIPID PANEL
Cholesterol: 178 mg/dL (ref 0–200)
HDL: 35.9 mg/dL — ABNORMAL LOW (ref >39.00)
LDL Cholesterol: 115 mg/dL — ABNORMAL HIGH (ref 0–99)
NonHDL: 141.7
Total CHOL/HDL Ratio: 5
Triglycerides: 134 mg/dL (ref 0.0–149.0)
VLDL: 26.8 mg/dL (ref 0.0–40.0)

## 2020-01-15 ENCOUNTER — Other Ambulatory Visit: Payer: Self-pay | Admitting: Family Medicine

## 2020-01-21 DIAGNOSIS — M62830 Muscle spasm of back: Secondary | ICD-10-CM | POA: Diagnosis not present

## 2020-01-21 DIAGNOSIS — M545 Low back pain: Secondary | ICD-10-CM | POA: Diagnosis not present

## 2020-01-21 DIAGNOSIS — M542 Cervicalgia: Secondary | ICD-10-CM | POA: Diagnosis not present

## 2020-01-23 DIAGNOSIS — M62830 Muscle spasm of back: Secondary | ICD-10-CM | POA: Diagnosis not present

## 2020-01-23 DIAGNOSIS — M542 Cervicalgia: Secondary | ICD-10-CM | POA: Diagnosis not present

## 2020-01-23 DIAGNOSIS — M545 Low back pain: Secondary | ICD-10-CM | POA: Diagnosis not present

## 2020-01-23 DIAGNOSIS — H524 Presbyopia: Secondary | ICD-10-CM | POA: Diagnosis not present

## 2020-01-24 ENCOUNTER — Encounter: Payer: BC Managed Care – PPO | Admitting: Family Medicine

## 2020-01-28 ENCOUNTER — Encounter: Payer: Self-pay | Admitting: Family Medicine

## 2020-01-28 ENCOUNTER — Ambulatory Visit (INDEPENDENT_AMBULATORY_CARE_PROVIDER_SITE_OTHER): Payer: BC Managed Care – PPO | Admitting: Family Medicine

## 2020-01-28 ENCOUNTER — Other Ambulatory Visit: Payer: Self-pay

## 2020-01-28 VITALS — BP 122/84 | HR 85 | Temp 97.2°F | Ht 71.0 in | Wt 250.0 lb

## 2020-01-28 DIAGNOSIS — Z0000 Encounter for general adult medical examination without abnormal findings: Secondary | ICD-10-CM

## 2020-01-28 DIAGNOSIS — M542 Cervicalgia: Secondary | ICD-10-CM | POA: Diagnosis not present

## 2020-01-28 DIAGNOSIS — M545 Low back pain: Secondary | ICD-10-CM | POA: Diagnosis not present

## 2020-01-28 DIAGNOSIS — Z7189 Other specified counseling: Secondary | ICD-10-CM

## 2020-01-28 DIAGNOSIS — R000 Tachycardia, unspecified: Secondary | ICD-10-CM

## 2020-01-28 DIAGNOSIS — I10 Essential (primary) hypertension: Secondary | ICD-10-CM

## 2020-01-28 DIAGNOSIS — M62830 Muscle spasm of back: Secondary | ICD-10-CM | POA: Diagnosis not present

## 2020-01-28 DIAGNOSIS — G4700 Insomnia, unspecified: Secondary | ICD-10-CM

## 2020-01-28 DIAGNOSIS — Z889 Allergy status to unspecified drugs, medicaments and biological substances status: Secondary | ICD-10-CM

## 2020-01-28 MED ORDER — DILTIAZEM HCL ER COATED BEADS 240 MG PO CP24: 240.0000 mg | ORAL_CAPSULE | Freq: Every day | ORAL | 3 refills | Status: DC

## 2020-01-28 MED ORDER — ZOLPIDEM TARTRATE 10 MG PO TABS: 5.0000 mg | ORAL_TABLET | Freq: Every evening | ORAL | 5 refills | Status: DC | PRN

## 2020-01-28 MED ORDER — VALSARTAN 80 MG PO TABS: 120.0000 mg | ORAL_TABLET | Freq: Every day | ORAL | 3 refills | Status: DC

## 2020-01-28 MED ORDER — CYCLOBENZAPRINE HCL 10 MG PO TABS: 5.0000 mg | ORAL_TABLET | Freq: Three times a day (TID) | ORAL | 12 refills | Status: DC | PRN

## 2020-01-28 MED ORDER — TRIAMCINOLONE ACETONIDE 0.1 % EX CREA: 1.0000 "application " | TOPICAL_CREAM | Freq: Two times a day (BID) | CUTANEOUS | 12 refills | Status: DC | PRN

## 2020-01-28 NOTE — Patient Instructions (Signed)
Thanks for your effort.  Keep working on diet and exercise.  Update me as needed.  Take care.  Glad to see you.

## 2020-01-28 NOTE — Progress Notes (Signed)
This visit occurred during the SARS-CoV-2 public health emergency.  Safety protocols were in place, including screening questions prior to the visit, additional usage of staff PPE, and extensive cleaning of exam room while observing appropriate contact time as indicated for disinfecting solutions.  CPE- See plan.  Routine anticipatory guidance given to patient.  See health maintenance.  The possibility exists that previously documented standard health maintenance information may have been brought forward from a previous encounter into this note.  If needed, that same information has been updated to reflect the current situation based on today's encounter.    Tetanus 2017 Flu shot 2020 at work.  covid vaccine 2021 PNA not due.  Shingles d/w pt.  Colonoscopy 2020 Prostate cancer screening and PSA options (with potential risks and benefits of testing vs not testing) were discussed along with recent recs/guidelines.  He declined testing PSA at this point. Living will prev done. Wife would be designated if he is incapacitated. Diet and exercise d/w pt.    He is still working swing shifts and uses ambien prn.  No ADE on med. Med helped.   Atopy controlled with TAC cream.    Hypertension:    Using medication without problems or lightheadedness: yes Chest pain with exertion:no Edema:no Short of breath:no He has occ pulse elevations, with relief with metoprolol.  On CCB at baseline.  Compliant with meds.  He had cards w/u.  He snores some but not waking with HA, not gasping for air.  No known apneas.    PMH and SH reviewed  Meds, vitals, and allergies reviewed.   ROS: Per HPI.  Unless specifically indicated otherwise in HPI, the patient denies:  General: fever. Eyes: acute vision changes ENT: sore throat Cardiovascular: chest pain Respiratory: SOB GI: vomiting GU: dysuria Musculoskeletal: acute back pain Derm: acute rash Neuro: acute motor dysfunction Psych: worsening  mood Endocrine: polydipsia Heme: bleeding Allergy: hayfever  GEN: nad, alert and oriented HEENT: mucous membranes moist NECK: supple w/o LA CV: rrr. PULM: ctab, no inc wob ABD: soft, +bs EXT: no edema SKIN: no acute rash

## 2020-01-30 DIAGNOSIS — M545 Low back pain: Secondary | ICD-10-CM | POA: Diagnosis not present

## 2020-01-30 DIAGNOSIS — M62830 Muscle spasm of back: Secondary | ICD-10-CM | POA: Diagnosis not present

## 2020-01-30 DIAGNOSIS — M542 Cervicalgia: Secondary | ICD-10-CM | POA: Diagnosis not present

## 2020-01-30 NOTE — Assessment & Plan Note (Signed)
Atopy controlled with TAC cream.   Continue as needed triamcinolone.

## 2020-01-30 NOTE — Assessment & Plan Note (Signed)
  He is still working swing shifts and uses ambien prn.  No ADE on med. Med helped.  Continue as needed Ambien.

## 2020-01-30 NOTE — Assessment & Plan Note (Signed)
Continue work on diet and exercise.  Continue baseline medications.  No change in valsartan.

## 2020-01-30 NOTE — Assessment & Plan Note (Signed)
Living will prev done.  Wife would be designated if he is incapacitated. ?

## 2020-01-30 NOTE — Assessment & Plan Note (Signed)
He has occ pulse elevations, with relief with metoprolol.  On CCB at baseline.  Compliant with meds.  He had cards w/u.  He snores some but not waking with HA, not gasping for air.  No known apneas.   Continue metoprolol.  No change in meds.

## 2020-01-30 NOTE — Assessment & Plan Note (Signed)
Tetanus 2017 Flu shot 2020 at work.  covid vaccine 2021 PNA not due.  Shingles d/w pt.  Colonoscopy 2020 Prostate cancer screening and PSA options (with potential risks and benefits of testing vs not testing) were discussed along with recent recs/guidelines.  He declined testing PSA at this point. Living will prev done. Wife would be designated if he is incapacitated. Diet and exercise d/w pt.

## 2020-02-06 DIAGNOSIS — M545 Low back pain: Secondary | ICD-10-CM | POA: Diagnosis not present

## 2020-02-06 DIAGNOSIS — M542 Cervicalgia: Secondary | ICD-10-CM | POA: Diagnosis not present

## 2020-02-06 DIAGNOSIS — M62830 Muscle spasm of back: Secondary | ICD-10-CM | POA: Diagnosis not present

## 2020-02-07 DIAGNOSIS — M545 Low back pain: Secondary | ICD-10-CM | POA: Diagnosis not present

## 2020-02-07 DIAGNOSIS — M542 Cervicalgia: Secondary | ICD-10-CM | POA: Diagnosis not present

## 2020-02-07 DIAGNOSIS — M62830 Muscle spasm of back: Secondary | ICD-10-CM | POA: Diagnosis not present

## 2020-02-14 DIAGNOSIS — M542 Cervicalgia: Secondary | ICD-10-CM | POA: Diagnosis not present

## 2020-02-14 DIAGNOSIS — M62830 Muscle spasm of back: Secondary | ICD-10-CM | POA: Diagnosis not present

## 2020-02-14 DIAGNOSIS — M545 Low back pain: Secondary | ICD-10-CM | POA: Diagnosis not present

## 2020-02-20 DIAGNOSIS — M545 Low back pain: Secondary | ICD-10-CM | POA: Diagnosis not present

## 2020-02-20 DIAGNOSIS — M542 Cervicalgia: Secondary | ICD-10-CM | POA: Diagnosis not present

## 2020-02-20 DIAGNOSIS — M62830 Muscle spasm of back: Secondary | ICD-10-CM | POA: Diagnosis not present

## 2020-02-28 DIAGNOSIS — M545 Low back pain: Secondary | ICD-10-CM | POA: Diagnosis not present

## 2020-02-28 DIAGNOSIS — M542 Cervicalgia: Secondary | ICD-10-CM | POA: Diagnosis not present

## 2020-02-28 DIAGNOSIS — M62830 Muscle spasm of back: Secondary | ICD-10-CM | POA: Diagnosis not present

## 2020-03-05 DIAGNOSIS — M545 Low back pain: Secondary | ICD-10-CM | POA: Diagnosis not present

## 2020-03-05 DIAGNOSIS — M542 Cervicalgia: Secondary | ICD-10-CM | POA: Diagnosis not present

## 2020-03-05 DIAGNOSIS — M62830 Muscle spasm of back: Secondary | ICD-10-CM | POA: Diagnosis not present

## 2020-03-18 DIAGNOSIS — M542 Cervicalgia: Secondary | ICD-10-CM | POA: Diagnosis not present

## 2020-03-18 DIAGNOSIS — M545 Low back pain: Secondary | ICD-10-CM | POA: Diagnosis not present

## 2020-03-18 DIAGNOSIS — M62830 Muscle spasm of back: Secondary | ICD-10-CM | POA: Diagnosis not present

## 2020-03-26 DIAGNOSIS — M542 Cervicalgia: Secondary | ICD-10-CM | POA: Diagnosis not present

## 2020-03-26 DIAGNOSIS — M62830 Muscle spasm of back: Secondary | ICD-10-CM | POA: Diagnosis not present

## 2020-03-26 DIAGNOSIS — M545 Low back pain: Secondary | ICD-10-CM | POA: Diagnosis not present

## 2020-04-03 DIAGNOSIS — M545 Low back pain: Secondary | ICD-10-CM | POA: Diagnosis not present

## 2020-04-03 DIAGNOSIS — M62830 Muscle spasm of back: Secondary | ICD-10-CM | POA: Diagnosis not present

## 2020-04-03 DIAGNOSIS — M542 Cervicalgia: Secondary | ICD-10-CM | POA: Diagnosis not present

## 2020-06-06 DIAGNOSIS — R0602 Shortness of breath: Secondary | ICD-10-CM | POA: Diagnosis not present

## 2020-06-06 DIAGNOSIS — J45909 Unspecified asthma, uncomplicated: Secondary | ICD-10-CM | POA: Diagnosis not present

## 2020-06-06 DIAGNOSIS — J984 Other disorders of lung: Secondary | ICD-10-CM | POA: Diagnosis not present

## 2020-07-18 ENCOUNTER — Other Ambulatory Visit: Payer: Self-pay | Admitting: Family Medicine

## 2020-07-18 NOTE — Telephone Encounter (Signed)
Electronic refill request. Zolpidem Last office visit:  01/28/2020 Last Filled:     30 tablet 5 01/28/2020

## 2020-07-20 NOTE — Telephone Encounter (Signed)
Sent. Thanks.   

## 2020-07-29 ENCOUNTER — Other Ambulatory Visit: Payer: Self-pay | Admitting: Family Medicine

## 2020-11-08 LAB — HM HEPATITIS C SCREENING LAB: HM Hepatitis Screen: NEGATIVE

## 2020-12-30 ENCOUNTER — Other Ambulatory Visit: Payer: Self-pay | Admitting: Family Medicine

## 2020-12-30 NOTE — Telephone Encounter (Signed)
Pharmacy requests refill on: Zolpidem 10 mg   LAST REFILL: 07/20/2020 (Q-30, R-5) LAST OV: 01/28/2020 NEXT OV: 01/30/2021 PHARMACY: [facility]   Earliest Fill Date: 01/17/2021

## 2021-01-08 DIAGNOSIS — M542 Cervicalgia: Secondary | ICD-10-CM | POA: Diagnosis not present

## 2021-01-08 DIAGNOSIS — M62830 Muscle spasm of back: Secondary | ICD-10-CM | POA: Diagnosis not present

## 2021-01-13 DIAGNOSIS — M542 Cervicalgia: Secondary | ICD-10-CM | POA: Diagnosis not present

## 2021-01-13 DIAGNOSIS — M62830 Muscle spasm of back: Secondary | ICD-10-CM | POA: Diagnosis not present

## 2021-01-14 DIAGNOSIS — M62830 Muscle spasm of back: Secondary | ICD-10-CM | POA: Diagnosis not present

## 2021-01-14 DIAGNOSIS — M542 Cervicalgia: Secondary | ICD-10-CM | POA: Diagnosis not present

## 2021-01-22 DIAGNOSIS — M542 Cervicalgia: Secondary | ICD-10-CM | POA: Diagnosis not present

## 2021-01-22 DIAGNOSIS — M62830 Muscle spasm of back: Secondary | ICD-10-CM | POA: Diagnosis not present

## 2021-01-30 ENCOUNTER — Ambulatory Visit (INDEPENDENT_AMBULATORY_CARE_PROVIDER_SITE_OTHER): Payer: BLUE CROSS/BLUE SHIELD | Admitting: Family Medicine

## 2021-01-30 ENCOUNTER — Encounter: Payer: Self-pay | Admitting: Family Medicine

## 2021-01-30 ENCOUNTER — Other Ambulatory Visit: Payer: Self-pay

## 2021-01-30 VITALS — BP 132/88 | HR 88 | Temp 97.7°F | Ht 71.0 in | Wt 247.0 lb

## 2021-01-30 DIAGNOSIS — M542 Cervicalgia: Secondary | ICD-10-CM | POA: Diagnosis not present

## 2021-01-30 DIAGNOSIS — Z0000 Encounter for general adult medical examination without abnormal findings: Secondary | ICD-10-CM

## 2021-01-30 DIAGNOSIS — G4700 Insomnia, unspecified: Secondary | ICD-10-CM

## 2021-01-30 DIAGNOSIS — I10 Essential (primary) hypertension: Secondary | ICD-10-CM

## 2021-01-30 DIAGNOSIS — Z7189 Other specified counseling: Secondary | ICD-10-CM

## 2021-01-30 DIAGNOSIS — Z889 Allergy status to unspecified drugs, medicaments and biological substances status: Secondary | ICD-10-CM

## 2021-01-30 DIAGNOSIS — M62830 Muscle spasm of back: Secondary | ICD-10-CM | POA: Diagnosis not present

## 2021-01-30 LAB — COMPREHENSIVE METABOLIC PANEL
ALT: 14 U/L (ref 0–53)
AST: 16 U/L (ref 0–37)
Albumin: 4.7 g/dL (ref 3.5–5.2)
Alkaline Phosphatase: 68 U/L (ref 39–117)
BUN: 18 mg/dL (ref 6–23)
CO2: 28 mEq/L (ref 19–32)
Calcium: 9.7 mg/dL (ref 8.4–10.5)
Chloride: 102 mEq/L (ref 96–112)
Creatinine, Ser: 1.25 mg/dL (ref 0.40–1.50)
GFR: 66.33 mL/min (ref >60.00)
Glucose, Bld: 94 mg/dL (ref 70–99)
Potassium: 4.4 mEq/L (ref 3.5–5.1)
Sodium: 139 mEq/L (ref 135–145)
Total Bilirubin: 0.7 mg/dL (ref 0.2–1.2)
Total Protein: 7.8 g/dL (ref 6.0–8.3)

## 2021-01-30 LAB — LIPID PANEL
Cholesterol: 231 mg/dL — ABNORMAL HIGH (ref 0–200)
HDL: 44.9 mg/dL (ref >39.00)
LDL Cholesterol: 155 mg/dL — ABNORMAL HIGH (ref 0–99)
NonHDL: 185.74
Total CHOL/HDL Ratio: 5
Triglycerides: 156 mg/dL — ABNORMAL HIGH (ref 0.0–149.0)
VLDL: 31.2 mg/dL (ref 0.0–40.0)

## 2021-01-30 MED ORDER — ZOLPIDEM TARTRATE 10 MG PO TABS: ORAL_TABLET | ORAL | 5 refills | Status: DC

## 2021-01-30 MED ORDER — VALSARTAN 80 MG PO TABS: 120.0000 mg | ORAL_TABLET | Freq: Every day | ORAL | 3 refills | Status: DC

## 2021-01-30 MED ORDER — DILTIAZEM HCL ER COATED BEADS 240 MG PO CP24: 240.0000 mg | ORAL_CAPSULE | Freq: Every day | ORAL | 3 refills | Status: DC

## 2021-01-30 MED ORDER — CYCLOBENZAPRINE HCL 10 MG PO TABS: 5.0000 mg | ORAL_TABLET | Freq: Three times a day (TID) | ORAL | 12 refills | Status: DC | PRN

## 2021-01-30 NOTE — Patient Instructions (Signed)
Go to the lab on the way out.   If you have mychart we'll likely use that to update you.    Take care.  Glad to see you. Thanks for your effort.   

## 2021-01-30 NOTE — Progress Notes (Signed)
This visit occurred during the SARS-CoV-2 public health emergency.  Safety protocols were in place, including screening questions prior to the visit, additional usage of staff PPE, and extensive cleaning of exam room while observing appropriate contact time as indicated for disinfecting solutions.  CPE- See plan.  Routine anticipatory guidance given to patient.  See health maintenance.  The possibility exists that previously documented standard health maintenance information may have been brought forward from a previous encounter into this note.  If needed, that same information has been updated to reflect the current situation based on today's encounter.    Tetanus 2017 Flu shot 2021 covid vaccine 2021 PNA not due.  Shingles d/w pt.  Colonoscopy 2020 Prostate cancer screening and PSA options (with potential risks and benefits of testing vs not testing) were discussed along with recent recs/guidelines.  He declined testing PSA at this point. Living will prev done. Wife would be designated if he is incapacitated. Diet and exercise d/w pt.   HCV screening done via [employer] cross 2022.    Sleep changes with swing shift, using ambien prn, no ADE on med.  It helps.  Atopy controlled with TAC cream.  needed more in the winter.   Hypertension:    Using medication without problems or lightheadedness: yes Chest pain with exertion:no Edema:no Short of breath:no Rare inc in heart rate, improved with beta blocker.   Prev w/u re: heart rate d/w pt.   1.  Normal sinus rhythm/sinus bradycardia/sinus tachycardia 2.  Occasional PVCs  He limits caffeine.   PMH and SH reviewed Meds, vitals, and allergies reviewed.   ROS: Per HPI.  Unless specifically indicated otherwise in HPI, the patient denies:  General: fever. Eyes: acute vision changes ENT: sore throat Cardiovascular: chest pain Respiratory: SOB GI: vomiting GU: dysuria Musculoskeletal: acute back pain Derm: acute rash Neuro: acute motor  dysfunction Psych: worsening mood Endocrine: polydipsia Heme: bleeding Allergy: hayfever  GEN: nad, alert and oriented HEENT: ncat NECK: supple w/o LA CV: rrr. PULM: ctab, no inc wob ABD: soft, +bs EXT: no edema SKIN: no acute rash

## 2021-02-01 NOTE — Assessment & Plan Note (Signed)
Tetanus 2017 Flu shot 2021 covid vaccine 2021 PNA not due.  Shingles d/w pt.  Colonoscopy 2020 Prostate cancer screening and PSA options (with potential risks and benefits of testing vs not testing) were discussed along with recent recs/guidelines.  He declined testing PSA at this point. Living will prev done. Wife would be designated if he is incapacitated. Diet and exercise d/w pt.   HCV screening done via [employer] 2022.

## 2021-02-01 NOTE — Assessment & Plan Note (Signed)
Living will prev done. Wife would be designated if he is incapacitated.

## 2021-02-01 NOTE — Assessment & Plan Note (Signed)
   Atopy controlled with TAC cream.  needed more in the winter.  Continue as needed use

## 2021-02-01 NOTE — Assessment & Plan Note (Signed)
  Sleep changes with swing shift, using ambien prn, no ADE on med.  It helps.

## 2021-02-01 NOTE — Assessment & Plan Note (Signed)
No change in meds at this point.  Continue diltiazem and valsartan.  He uses metoprolol as needed.  See notes on labs.

## 2021-02-03 DIAGNOSIS — M542 Cervicalgia: Secondary | ICD-10-CM | POA: Diagnosis not present

## 2021-02-03 DIAGNOSIS — M62830 Muscle spasm of back: Secondary | ICD-10-CM | POA: Diagnosis not present

## 2021-02-03 DIAGNOSIS — H524 Presbyopia: Secondary | ICD-10-CM | POA: Diagnosis not present

## 2021-02-13 DIAGNOSIS — M62830 Muscle spasm of back: Secondary | ICD-10-CM | POA: Diagnosis not present

## 2021-02-13 DIAGNOSIS — M542 Cervicalgia: Secondary | ICD-10-CM | POA: Diagnosis not present

## 2021-02-26 DIAGNOSIS — M542 Cervicalgia: Secondary | ICD-10-CM | POA: Diagnosis not present

## 2021-02-26 DIAGNOSIS — M62830 Muscle spasm of back: Secondary | ICD-10-CM | POA: Diagnosis not present

## 2021-04-01 DIAGNOSIS — M62830 Muscle spasm of back: Secondary | ICD-10-CM | POA: Diagnosis not present

## 2021-04-01 DIAGNOSIS — M542 Cervicalgia: Secondary | ICD-10-CM | POA: Diagnosis not present

## 2021-04-15 DIAGNOSIS — M542 Cervicalgia: Secondary | ICD-10-CM | POA: Diagnosis not present

## 2021-04-15 DIAGNOSIS — M62830 Muscle spasm of back: Secondary | ICD-10-CM | POA: Diagnosis not present

## 2021-04-24 DIAGNOSIS — M542 Cervicalgia: Secondary | ICD-10-CM | POA: Diagnosis not present

## 2021-04-24 DIAGNOSIS — M62830 Muscle spasm of back: Secondary | ICD-10-CM | POA: Diagnosis not present

## 2021-07-22 ENCOUNTER — Other Ambulatory Visit: Payer: Self-pay | Admitting: Family Medicine

## 2021-07-22 NOTE — Telephone Encounter (Signed)
Last office visit 01/30/2021 for CPE.  Last refilled 01/30/2021 for #30 with 5 refills.  No future appointments.

## 2021-07-22 NOTE — Telephone Encounter (Addendum)
Attempted to send but couldn't complete due to pending system update.  Will try again later.    ========== Sent. Thanks.

## 2022-01-25 ENCOUNTER — Other Ambulatory Visit: Payer: Self-pay | Admitting: Family Medicine

## 2022-02-04 ENCOUNTER — Ambulatory Visit (INDEPENDENT_AMBULATORY_CARE_PROVIDER_SITE_OTHER): Payer: BC Managed Care – PPO | Admitting: Family Medicine

## 2022-02-04 ENCOUNTER — Encounter: Payer: Self-pay | Admitting: Family Medicine

## 2022-02-04 ENCOUNTER — Encounter: Payer: BC Managed Care – PPO | Admitting: Family Medicine

## 2022-02-04 VITALS — BP 124/80 | HR 81 | Temp 97.2°F | Ht 71.0 in | Wt 257.0 lb

## 2022-02-04 DIAGNOSIS — I10 Essential (primary) hypertension: Secondary | ICD-10-CM | POA: Diagnosis not present

## 2022-02-04 DIAGNOSIS — Z0000 Encounter for general adult medical examination without abnormal findings: Secondary | ICD-10-CM

## 2022-02-04 DIAGNOSIS — G4700 Insomnia, unspecified: Secondary | ICD-10-CM

## 2022-02-04 DIAGNOSIS — Z7189 Other specified counseling: Secondary | ICD-10-CM

## 2022-02-04 DIAGNOSIS — M62838 Other muscle spasm: Secondary | ICD-10-CM

## 2022-02-04 LAB — LIPID PANEL
Cholesterol: 206 mg/dL — ABNORMAL HIGH (ref 0–200)
HDL: 42.5 mg/dL (ref >39.00)
NonHDL: 163.96
Total CHOL/HDL Ratio: 5
Triglycerides: 270 mg/dL — ABNORMAL HIGH (ref 0.0–149.0)
VLDL: 54 mg/dL — ABNORMAL HIGH (ref 0.0–40.0)

## 2022-02-04 LAB — COMPREHENSIVE METABOLIC PANEL
ALT: 11 U/L (ref 0–53)
AST: 16 U/L (ref 0–37)
Albumin: 4.6 g/dL (ref 3.5–5.2)
Alkaline Phosphatase: 64 U/L (ref 39–117)
BUN: 20 mg/dL (ref 6–23)
CO2: 27 mEq/L (ref 19–32)
Calcium: 9.5 mg/dL (ref 8.4–10.5)
Chloride: 102 mEq/L (ref 96–112)
Creatinine, Ser: 1.22 mg/dL (ref 0.40–1.50)
GFR: 67.81 mL/min (ref >60.00)
Glucose, Bld: 95 mg/dL (ref 70–99)
Potassium: 4.6 mEq/L (ref 3.5–5.1)
Sodium: 137 mEq/L (ref 135–145)
Total Bilirubin: 0.7 mg/dL (ref 0.2–1.2)
Total Protein: 7.2 g/dL (ref 6.0–8.3)

## 2022-02-04 LAB — LDL CHOLESTEROL, DIRECT: Direct LDL: 131 mg/dL

## 2022-02-04 MED ORDER — ZOLPIDEM TARTRATE 10 MG PO TABS: 5.0000 mg | ORAL_TABLET | Freq: Every evening | ORAL | 5 refills | Status: DC | PRN

## 2022-02-04 MED ORDER — VALSARTAN 80 MG PO TABS: 120.0000 mg | ORAL_TABLET | Freq: Every day | ORAL | 3 refills | Status: DC

## 2022-02-04 MED ORDER — DILTIAZEM HCL ER COATED BEADS 240 MG PO CP24: 240.0000 mg | ORAL_CAPSULE | Freq: Every day | ORAL | 3 refills | Status: DC

## 2022-02-04 MED ORDER — TRIAMCINOLONE ACETONIDE 0.1 % EX CREA: 1.0000 "application " | TOPICAL_CREAM | Freq: Two times a day (BID) | CUTANEOUS | 12 refills | Status: AC | PRN

## 2022-02-04 MED ORDER — CYCLOBENZAPRINE HCL 10 MG PO TABS: 5.0000 mg | ORAL_TABLET | Freq: Three times a day (TID) | ORAL | 3 refills | Status: DC | PRN

## 2022-02-04 NOTE — Patient Instructions (Addendum)
Let me know if you have trouble getting an appointment with GI this fall.  ?Go to the lab on the way out.   If you have mychart we'll likely use that to update you.    ?Take care.  Glad to see you. ?

## 2022-02-04 NOTE — Progress Notes (Signed)
CPE- See plan.  Routine anticipatory guidance given to patient.  See health maintenance.  The possibility exists that previously documented standard health maintenance information may have been brought forward from a previous encounter into this note.  If needed, that same information has been updated to reflect the current situation based on today's encounter.   ? ?Tetanus 2017 ?Flu shot 2022 at work.  ?covid vaccine 2021 ?PNA not due.  ?Shingles d/w pt.  ?Colonoscopy 2020 ?Prostate cancer screening and PSA options (with potential risks and benefits of testing vs not testing) were discussed along with recent recs/guidelines.  He declined testing PSA at this point. ?Living will prev done.  Wife would be designated if he is incapacitated. ?Diet and exercise d/w pt.   ?HCV screening done via [facility] 2022.   ? ?No recent pancreatitis sx.  D/w pt.   ? ?Hypertension:    ?Using medication without problems or lightheadedness: yes ?Chest pain with exertion: no ?Edema:no ?Short of breath:no ?Labs pending.  ?Rare use of metoprolol.  Still on ARB and CCB.   ? ?Taking flexeril prn.  No ADE on med.  It helps.   ? ?Insomnia.  Taking ambien prn.  Work schedule d/w pt.  No ADE on med.  Shift work.   ? ?PMH and SH reviewed ? ?Meds, vitals, and allergies reviewed.  ? ?ROS: Per HPI.  Unless specifically indicated otherwise in HPI, the patient denies: ? ?General: fever. ?Eyes: acute vision changes ?ENT: sore throat ?Cardiovascular: chest pain ?Respiratory: SOB ?GI: vomiting ?GU: dysuria ?Musculoskeletal: acute back pain ?Derm: acute rash ?Neuro: acute motor dysfunction ?Psych: worsening mood ?Endocrine: polydipsia ?Heme: bleeding ?Allergy: hayfever ? ?GEN: nad, alert and oriented ?HEENT: ncat ?NECK: supple w/o LA ?CV: rrr. ?PULM: ctab, no inc wob ?ABD: soft, +bs ?EXT: no edema ?SKIN: Well-perfused. ?

## 2022-02-07 NOTE — Assessment & Plan Note (Signed)
Can use Flexeril as needed. ?

## 2022-02-07 NOTE — Assessment & Plan Note (Signed)
Taking ambien prn.  Work schedule d/w pt.  No ADE on med.  Shift work.   Continue as needed Ambien use.  Routine cautions given to patient. ?

## 2022-02-07 NOTE — Assessment & Plan Note (Signed)
?  Labs pending.  ?Rare use of metoprolol.  Still on ARB and CCB.  No change in medication.  See notes on labs.  Continue work on diet and exercise. ?

## 2022-02-07 NOTE — Assessment & Plan Note (Signed)
Tetanus 2017 ?Flu shot 2022 at work.  ?covid vaccine 2021 ?PNA not due.  ?Shingles d/w pt.  ?Colonoscopy 2020 ?Prostate cancer screening and PSA options (with potential risks and benefits of testing vs not testing) were discussed along with recent recs/guidelines.  He declined testing PSA at this point. ?Living will prev done.  Wife would be designated if he is incapacitated. ?Diet and exercise d/w pt.   ?HCV screening done via [facility] 2022.   ?

## 2022-02-07 NOTE — Assessment & Plan Note (Signed)
Living will prev done.  Wife would be designated if he is incapacitated. ?

## 2022-03-22 ENCOUNTER — Encounter: Payer: BLUE CROSS/BLUE SHIELD | Admitting: Family Medicine

## 2022-04-01 DIAGNOSIS — H524 Presbyopia: Secondary | ICD-10-CM | POA: Diagnosis not present

## 2022-05-12 DIAGNOSIS — H35033 Hypertensive retinopathy, bilateral: Secondary | ICD-10-CM | POA: Diagnosis not present

## 2022-05-12 DIAGNOSIS — H43823 Vitreomacular adhesion, bilateral: Secondary | ICD-10-CM | POA: Diagnosis not present

## 2022-06-27 ENCOUNTER — Other Ambulatory Visit: Payer: Self-pay | Admitting: Family Medicine

## 2022-07-26 ENCOUNTER — Other Ambulatory Visit: Payer: Self-pay | Admitting: Family Medicine

## 2022-07-26 NOTE — Telephone Encounter (Signed)
Refill request for zolpidem 10 mg tablet  LOV - 02/04/22 Next OV - not scheduled Last refill - 02/04/22 #30/5

## 2022-08-31 DIAGNOSIS — S90852A Superficial foreign body, left foot, initial encounter: Secondary | ICD-10-CM | POA: Diagnosis not present

## 2022-09-01 DIAGNOSIS — H35033 Hypertensive retinopathy, bilateral: Secondary | ICD-10-CM | POA: Diagnosis not present

## 2022-09-01 DIAGNOSIS — H43823 Vitreomacular adhesion, bilateral: Secondary | ICD-10-CM | POA: Diagnosis not present

## 2023-02-03 ENCOUNTER — Other Ambulatory Visit: Payer: Self-pay | Admitting: Family Medicine

## 2023-03-04 ENCOUNTER — Ambulatory Visit (INDEPENDENT_AMBULATORY_CARE_PROVIDER_SITE_OTHER): Payer: BC Managed Care – PPO | Admitting: Family Medicine

## 2023-03-04 ENCOUNTER — Encounter: Payer: Self-pay | Admitting: Family Medicine

## 2023-03-04 VITALS — BP 120/78 | HR 82 | Temp 97.3°F | Ht 71.0 in | Wt 244.0 lb

## 2023-03-04 DIAGNOSIS — Z0000 Encounter for general adult medical examination without abnormal findings: Secondary | ICD-10-CM

## 2023-03-04 DIAGNOSIS — G4700 Insomnia, unspecified: Secondary | ICD-10-CM

## 2023-03-04 DIAGNOSIS — I10 Essential (primary) hypertension: Secondary | ICD-10-CM | POA: Diagnosis not present

## 2023-03-04 DIAGNOSIS — Z634 Disappearance and death of family member: Secondary | ICD-10-CM

## 2023-03-04 DIAGNOSIS — M62838 Other muscle spasm: Secondary | ICD-10-CM

## 2023-03-04 DIAGNOSIS — Z7189 Other specified counseling: Secondary | ICD-10-CM

## 2023-03-04 LAB — COMPREHENSIVE METABOLIC PANEL
ALT: 12 U/L (ref 0–53)
AST: 17 U/L (ref 0–37)
Albumin: 4 g/dL (ref 3.5–5.2)
Alkaline Phosphatase: 66 U/L (ref 39–117)
BUN: 14 mg/dL (ref 6–23)
CO2: 28 mEq/L (ref 19–32)
Calcium: 8.9 mg/dL (ref 8.4–10.5)
Chloride: 104 mEq/L (ref 96–112)
Creatinine, Ser: 1.21 mg/dL (ref 0.40–1.50)
GFR: 67.97 mL/min (ref >60.00)
Glucose, Bld: 102 mg/dL — ABNORMAL HIGH (ref 70–99)
Potassium: 4.3 mEq/L (ref 3.5–5.1)
Sodium: 139 mEq/L (ref 135–145)
Total Bilirubin: 0.6 mg/dL (ref 0.2–1.2)
Total Protein: 6.8 g/dL (ref 6.0–8.3)

## 2023-03-04 LAB — LIPID PANEL
Cholesterol: 172 mg/dL (ref 0–200)
HDL: 40.9 mg/dL (ref >39.00)
LDL Cholesterol: 106 mg/dL — ABNORMAL HIGH (ref 0–99)
NonHDL: 131.51
Total CHOL/HDL Ratio: 4
Triglycerides: 130 mg/dL (ref 0.0–149.0)
VLDL: 26 mg/dL (ref 0.0–40.0)

## 2023-03-04 MED ORDER — CYCLOBENZAPRINE HCL 10 MG PO TABS: 5.0000 mg | ORAL_TABLET | Freq: Three times a day (TID) | ORAL | 3 refills | Status: DC | PRN

## 2023-03-04 MED ORDER — VALSARTAN 80 MG PO TABS: 120.0000 mg | ORAL_TABLET | Freq: Every day | ORAL | 3 refills | Status: DC

## 2023-03-04 MED ORDER — DILTIAZEM HCL ER COATED BEADS 240 MG PO CP24: 240.0000 mg | ORAL_CAPSULE | Freq: Every day | ORAL | 3 refills | Status: DC

## 2023-03-04 MED ORDER — ZOLPIDEM TARTRATE 10 MG PO TABS: 5.0000 mg | ORAL_TABLET | Freq: Every evening | ORAL | 5 refills | Status: DC | PRN

## 2023-03-04 NOTE — Patient Instructions (Signed)
Thank you for your effort.  Go to the lab on the way out.   If you have mychart we'll likely use that to update you.    Take care.  Glad to see you.

## 2023-03-04 NOTE — Progress Notes (Unsigned)
CPE- See plan.  Routine anticipatory guidance given to patient.  See health maintenance.  The possibility exists that previously documented standard health maintenance information may have been brought forward from a previous encounter into this note.  If needed, that same information has been updated to reflect the current situation based on today's encounter.    Tetanus 04-13-16 Flu shot April 13, 2021 at work.  covid vaccine 04/13/2020 PNA not due.  Shingles d/w pt.  Colonoscopy Apr 14, 2019- d/w pt about follow up when possible.   Prostate cancer screening and PSA options (with potential risks and benefits of testing vs not testing) were discussed along with recent recs/guidelines.  He declined testing PSA at this point. Living will prev done.  he'll consider, d/w pt.   Diet and exercise d/w pt.   HCV screening done via [facility] 2021/04/13.    Hypertension:               Using medication without problems or lightheadedness: yes Chest pain with exertion: no Edema:no Short of breath:no Labs pending.  Rare use of metoprolol.  Still on ARB and CCB.     Taking flexeril prn.  No ADE on med.  It helps.     Insomnia.  Taking ambien prn.  Work schedule d/w pt.  No ADE on med.  Shift work, more nights recently.   His wife died last 2023-04-14, d/w pt.  Condolences offered.  Living alone.  Son is out of state.    PMH and SH reviewed  Meds, vitals, and allergies reviewed.   ROS: Per HPI.  Unless specifically indicated otherwise in HPI, the patient denies:  General: fever. Eyes: acute vision changes ENT: sore throat Cardiovascular: chest pain Respiratory: SOB GI: vomiting GU: dysuria Musculoskeletal: acute back pain Derm: acute rash Neuro: acute motor dysfunction Psych: worsening mood Endocrine: polydipsia Heme: bleeding Allergy: hayfever  GEN: nad, alert and oriented HEENT: mucous membranes moist NECK: supple w/o LA CV: rrr. PULM: ctab, no inc wob ABD: soft, +bs EXT: no edema SKIN: no acute rash

## 2023-03-06 DIAGNOSIS — Z634 Disappearance and death of family member: Secondary | ICD-10-CM | POA: Insufficient documentation

## 2023-03-06 NOTE — Assessment & Plan Note (Signed)
Continue as needed Flexeril 

## 2023-03-06 NOTE — Assessment & Plan Note (Signed)
Taking ambien prn.  Work schedule d/w pt.  No ADE on med.  Shift work, more nights recently.  Would continue as is.

## 2023-03-06 NOTE — Assessment & Plan Note (Signed)
Tetanus 2017 Flu shot 2022 at work.  covid vaccine 2021 PNA not due.  Shingles d/w pt.  Colonoscopy 2020- d/w pt about follow up when possible.   Prostate cancer screening and PSA options (with potential risks and benefits of testing vs not testing) were discussed along with recent recs/guidelines.  He declined testing PSA at this point. Living will d/w pt.  He'll consider, d/w pt.   Diet and exercise d/w pt.   HCV screening done via [facility] 2022.

## 2023-03-06 NOTE — Assessment & Plan Note (Signed)
Continue diltiazem and valsartan with rare use/need for metoprolol.

## 2023-03-06 NOTE — Assessment & Plan Note (Signed)
His wife died last May, d/w pt.  Condolences offered.  Living alone.  Son is out of state.  We talked about his advance directive.  He will consider options.  I asked him to update me as needed.

## 2023-03-06 NOTE — Assessment & Plan Note (Signed)
Living will d/w pt.  He'll consider, d/w pt.

## 2023-04-03 DIAGNOSIS — Z888 Allergy status to other drugs, medicaments and biological substances status: Secondary | ICD-10-CM | POA: Diagnosis not present

## 2023-04-03 DIAGNOSIS — R1011 Right upper quadrant pain: Secondary | ICD-10-CM | POA: Diagnosis not present

## 2023-04-03 DIAGNOSIS — Z79899 Other long term (current) drug therapy: Secondary | ICD-10-CM | POA: Diagnosis not present

## 2023-04-03 DIAGNOSIS — Z880 Allergy status to penicillin: Secondary | ICD-10-CM | POA: Diagnosis not present

## 2023-04-03 DIAGNOSIS — R1012 Left upper quadrant pain: Secondary | ICD-10-CM | POA: Diagnosis not present

## 2023-04-03 DIAGNOSIS — K589 Irritable bowel syndrome without diarrhea: Secondary | ICD-10-CM | POA: Diagnosis not present

## 2023-04-03 DIAGNOSIS — R1013 Epigastric pain: Secondary | ICD-10-CM | POA: Diagnosis not present

## 2023-04-03 DIAGNOSIS — R110 Nausea: Secondary | ICD-10-CM | POA: Diagnosis not present

## 2023-07-11 ENCOUNTER — Other Ambulatory Visit: Payer: Self-pay | Admitting: Family Medicine

## 2023-08-03 DIAGNOSIS — H3561 Retinal hemorrhage, right eye: Secondary | ICD-10-CM | POA: Diagnosis not present

## 2023-09-18 ENCOUNTER — Other Ambulatory Visit: Payer: Self-pay | Admitting: Family Medicine

## 2023-09-19 NOTE — Telephone Encounter (Signed)
Refill request for zolpidem 10 mg tablet and flexeril 10 mg tabs  LOV - 03/04/23 Next OV - not scheduled Last refill - Zolpidem 03/04/23 #30/5                   Flexeril 07/13/23 #90/1

## 2023-09-29 DIAGNOSIS — I10 Essential (primary) hypertension: Secondary | ICD-10-CM | POA: Diagnosis not present

## 2023-09-29 DIAGNOSIS — N539 Unspecified male sexual dysfunction: Secondary | ICD-10-CM | POA: Diagnosis not present

## 2023-09-29 DIAGNOSIS — E559 Vitamin D deficiency, unspecified: Secondary | ICD-10-CM | POA: Diagnosis not present

## 2023-09-29 DIAGNOSIS — R635 Abnormal weight gain: Secondary | ICD-10-CM | POA: Diagnosis not present

## 2023-09-29 DIAGNOSIS — R5383 Other fatigue: Secondary | ICD-10-CM | POA: Diagnosis not present

## 2023-10-12 DIAGNOSIS — Z1331 Encounter for screening for depression: Secondary | ICD-10-CM | POA: Diagnosis not present

## 2023-10-12 DIAGNOSIS — Z6834 Body mass index (BMI) 34.0-34.9, adult: Secondary | ICD-10-CM | POA: Diagnosis not present

## 2023-10-12 DIAGNOSIS — R635 Abnormal weight gain: Secondary | ICD-10-CM | POA: Diagnosis not present

## 2023-10-12 DIAGNOSIS — E669 Obesity, unspecified: Secondary | ICD-10-CM | POA: Diagnosis not present

## 2023-10-12 DIAGNOSIS — E8881 Metabolic syndrome: Secondary | ICD-10-CM | POA: Diagnosis not present

## 2023-10-12 DIAGNOSIS — N529 Male erectile dysfunction, unspecified: Secondary | ICD-10-CM | POA: Diagnosis not present

## 2023-10-18 DIAGNOSIS — K5730 Diverticulosis of large intestine without perforation or abscess without bleeding: Secondary | ICD-10-CM | POA: Diagnosis not present

## 2023-10-18 DIAGNOSIS — R1110 Vomiting, unspecified: Secondary | ICD-10-CM | POA: Diagnosis not present

## 2023-10-18 DIAGNOSIS — Z825 Family history of asthma and other chronic lower respiratory diseases: Secondary | ICD-10-CM | POA: Diagnosis not present

## 2023-10-18 DIAGNOSIS — R0902 Hypoxemia: Secondary | ICD-10-CM | POA: Diagnosis not present

## 2023-10-18 DIAGNOSIS — J9691 Respiratory failure, unspecified with hypoxia: Secondary | ICD-10-CM | POA: Diagnosis not present

## 2023-10-18 DIAGNOSIS — K851 Biliary acute pancreatitis without necrosis or infection: Secondary | ICD-10-CM | POA: Diagnosis not present

## 2023-10-18 DIAGNOSIS — K861 Other chronic pancreatitis: Secondary | ICD-10-CM | POA: Diagnosis not present

## 2023-10-18 DIAGNOSIS — Z880 Allergy status to penicillin: Secondary | ICD-10-CM | POA: Diagnosis not present

## 2023-10-18 DIAGNOSIS — K8010 Calculus of gallbladder with chronic cholecystitis without obstruction: Secondary | ICD-10-CM | POA: Diagnosis not present

## 2023-10-18 DIAGNOSIS — K859 Acute pancreatitis without necrosis or infection, unspecified: Secondary | ICD-10-CM | POA: Diagnosis not present

## 2023-10-18 DIAGNOSIS — Z6835 Body mass index (BMI) 35.0-35.9, adult: Secondary | ICD-10-CM | POA: Diagnosis not present

## 2023-10-18 DIAGNOSIS — F419 Anxiety disorder, unspecified: Secondary | ICD-10-CM | POA: Diagnosis not present

## 2023-10-18 DIAGNOSIS — E669 Obesity, unspecified: Secondary | ICD-10-CM | POA: Diagnosis not present

## 2023-10-18 DIAGNOSIS — K4090 Unilateral inguinal hernia, without obstruction or gangrene, not specified as recurrent: Secondary | ICD-10-CM | POA: Diagnosis not present

## 2023-10-18 DIAGNOSIS — R1084 Generalized abdominal pain: Secondary | ICD-10-CM | POA: Diagnosis not present

## 2023-10-18 DIAGNOSIS — K8012 Calculus of gallbladder with acute and chronic cholecystitis without obstruction: Secondary | ICD-10-CM | POA: Diagnosis not present

## 2023-10-18 DIAGNOSIS — Z87442 Personal history of urinary calculi: Secondary | ICD-10-CM | POA: Diagnosis not present

## 2023-10-18 DIAGNOSIS — I10 Essential (primary) hypertension: Secondary | ICD-10-CM | POA: Diagnosis not present

## 2023-10-18 DIAGNOSIS — Z8249 Family history of ischemic heart disease and other diseases of the circulatory system: Secondary | ICD-10-CM | POA: Diagnosis not present

## 2023-10-18 DIAGNOSIS — Z841 Family history of disorders of kidney and ureter: Secondary | ICD-10-CM | POA: Diagnosis not present

## 2023-10-18 DIAGNOSIS — R1013 Epigastric pain: Secondary | ICD-10-CM | POA: Diagnosis not present

## 2023-10-18 DIAGNOSIS — K8500 Idiopathic acute pancreatitis without necrosis or infection: Secondary | ICD-10-CM | POA: Diagnosis not present

## 2023-10-18 DIAGNOSIS — Z79890 Hormone replacement therapy (postmenopausal): Secondary | ICD-10-CM | POA: Diagnosis not present

## 2023-10-18 DIAGNOSIS — K8020 Calculus of gallbladder without cholecystitis without obstruction: Secondary | ICD-10-CM | POA: Diagnosis not present

## 2023-10-18 DIAGNOSIS — Z8719 Personal history of other diseases of the digestive system: Secondary | ICD-10-CM | POA: Diagnosis not present

## 2023-10-18 DIAGNOSIS — Z888 Allergy status to other drugs, medicaments and biological substances status: Secondary | ICD-10-CM | POA: Diagnosis not present

## 2023-10-19 DIAGNOSIS — K8020 Calculus of gallbladder without cholecystitis without obstruction: Secondary | ICD-10-CM | POA: Diagnosis not present

## 2023-10-19 DIAGNOSIS — K859 Acute pancreatitis without necrosis or infection, unspecified: Secondary | ICD-10-CM | POA: Diagnosis not present

## 2023-10-19 DIAGNOSIS — R0902 Hypoxemia: Secondary | ICD-10-CM | POA: Diagnosis not present

## 2023-10-19 DIAGNOSIS — I10 Essential (primary) hypertension: Secondary | ICD-10-CM | POA: Diagnosis not present

## 2023-10-20 DIAGNOSIS — K8020 Calculus of gallbladder without cholecystitis without obstruction: Secondary | ICD-10-CM | POA: Diagnosis not present

## 2023-10-20 DIAGNOSIS — K859 Acute pancreatitis without necrosis or infection, unspecified: Secondary | ICD-10-CM | POA: Diagnosis not present

## 2023-10-20 DIAGNOSIS — R0902 Hypoxemia: Secondary | ICD-10-CM | POA: Diagnosis not present

## 2023-10-20 DIAGNOSIS — K851 Biliary acute pancreatitis without necrosis or infection: Secondary | ICD-10-CM | POA: Diagnosis not present

## 2023-10-20 DIAGNOSIS — I10 Essential (primary) hypertension: Secondary | ICD-10-CM | POA: Diagnosis not present

## 2023-10-21 DIAGNOSIS — I10 Essential (primary) hypertension: Secondary | ICD-10-CM | POA: Diagnosis not present

## 2023-10-21 DIAGNOSIS — R0902 Hypoxemia: Secondary | ICD-10-CM | POA: Diagnosis not present

## 2023-10-21 DIAGNOSIS — K859 Acute pancreatitis without necrosis or infection, unspecified: Secondary | ICD-10-CM | POA: Diagnosis not present

## 2023-10-21 DIAGNOSIS — K8020 Calculus of gallbladder without cholecystitis without obstruction: Secondary | ICD-10-CM | POA: Diagnosis not present

## 2023-10-22 DIAGNOSIS — I10 Essential (primary) hypertension: Secondary | ICD-10-CM | POA: Diagnosis not present

## 2023-10-22 DIAGNOSIS — K8012 Calculus of gallbladder with acute and chronic cholecystitis without obstruction: Secondary | ICD-10-CM | POA: Diagnosis not present

## 2023-10-22 DIAGNOSIS — K851 Biliary acute pancreatitis without necrosis or infection: Secondary | ICD-10-CM | POA: Diagnosis not present

## 2023-10-22 DIAGNOSIS — K8010 Calculus of gallbladder with chronic cholecystitis without obstruction: Secondary | ICD-10-CM | POA: Diagnosis not present

## 2023-10-23 DIAGNOSIS — I10 Essential (primary) hypertension: Secondary | ICD-10-CM | POA: Diagnosis not present

## 2023-10-23 DIAGNOSIS — K851 Biliary acute pancreatitis without necrosis or infection: Secondary | ICD-10-CM | POA: Diagnosis not present

## 2023-10-24 ENCOUNTER — Telehealth: Payer: Self-pay

## 2023-10-24 NOTE — Transitions of Care (Post Inpatient/ED Visit) (Signed)
10/24/2023  Name: Caleb [name] MRN: [id number] DOB: May 17, 1966  Today's TOC FU Call Status: Today's TOC FU Call Status:: Successful TOC FU Call Completed TOC FU Call Complete Date: 10/24/23 Patient's Name and Date of Birth confirmed.  Transition Care Management Follow-up Telephone Call Date of Discharge: 10/23/23 Discharge Facility: Other [facility]) Name of Other (Non[facility]) Discharge Facility: [facility] Type of Discharge: Inpatient Admission Primary Inpatient Discharge Diagnosis:: Pancreatitis How have you been since you were released from the hospital?: Better Any questions or concerns?: No  Items Reviewed: Did you receive and understand the discharge instructions provided?: Yes Medications obtained,verified, and reconciled?: Yes (Medications Reviewed) Any new allergies since your discharge?: No Dietary orders reviewed?: Yes Type of Diet Ordered:: Heart Healthy Do you have support at home?: Yes People in Home: spouse Name of Support/Comfort Primary Source: [name]  Medications Reviewed Today: Medications Reviewed Today     Reviewed by Redge Gainer, RN (Case Manager) on 10/24/23 at 1030  Med List Status: <None>   Medication Order Taking? Sig Documenting Provider Last Dose Status Informant  aspirin 81 MG tablet [id number] No Take 81 mg by mouth. 2 times weekly [provider] Taking Active   cyclobenzaprine (FLEXERIL) 10 MG tablet [id number]  Take 0.5-1 tablets (5-10 mg total) by mouth 3 (three) times daily as needed for muscle spasms (sedation caution). Joaquim Nam, MD  Active   diltiazem (CARDIZEM CD) 240 MG 24 hr capsule [id number]  Take 1 capsule (240 mg total) by mouth daily. Joaquim Nam, MD  Active   metoprolol tartrate (LOPRESSOR) 25 MG tablet [id number] No Take 1/2-1 tablet (12.5-25 mg total) by mouth 2 (two) times daily as needed. Joaquim Nam, MD Taking Active   Multiple Vitamin (MULTIVITAMIN) tablet [id number] No Take 1  tablet by mouth daily. [provider] Taking Active   triamcinolone cream (KENALOG) 0.1 % [id number] No Apply 1 application. topically 2 (two) times daily as needed. Joaquim Nam, MD Taking Active   valsartan (DIOVAN) 80 MG tablet [id number]  Take 1.5 tablets (120 mg total) by mouth daily. Joaquim Nam, MD  Active   zolpidem (AMBIEN) 10 MG tablet [id number]  Take 0.5-1 tablets (5-10 mg total) by mouth at bedtime as needed for sleep. Joaquim Nam, MD  Active             Home Care and Equipment/Supplies: Were Home Health Services Ordered?: No Any new equipment or medical supplies ordered?: No  Functional Questionnaire: Do you need assistance with bathing/showering or dressing?: No Do you need assistance with meal preparation?: No Do you need assistance with eating?: No Do you have difficulty maintaining continence: No Do you need assistance with getting out of bed/getting out of a chair/moving?: No Do you have difficulty managing or taking your medications?: No  Follow up appointments reviewed: PCP Follow-up appointment confirmed?: No MD Provider Line Number:[phone] Given: No (The patient declines) Specialist Hospital Follow-up appointment confirmed?: No Reason Specialist Follow-Up Not Confirmed: Patient has Specialist Provider Number and will Call for Appointment Do you need transportation to your follow-up appointment?: No Do you understand care options if your condition(s) worsen?: Yes-patient verbalized understanding  SDOH Interventions Today    Flowsheet Row Most Recent Value  SDOH Interventions   Food Insecurity Interventions Intervention Not Indicated  Housing Interventions Intervention Not Indicated  Transportation Interventions Intervention Not Indicated  Utilities Interventions Intervention Not Indicated      TOC Outreach completed today. The patient states he understands his discharge  instructions and he does not have any questions. He  declines a follow up appointment to PCP and he will schedule and appointment with GI.  The patient has been provided with contact information for the care management team and has been advised to call with any health-related questions or concerns. The patient verbalized understanding with current POC. The patient is directed to their insurance card regarding availability of benefits coverage.  Deidre Ala, RN Medical illustrator [facility] [phone]

## 2023-10-27 ENCOUNTER — Encounter: Payer: Self-pay | Admitting: Family Medicine

## 2023-10-31 DIAGNOSIS — N529 Male erectile dysfunction, unspecified: Secondary | ICD-10-CM | POA: Diagnosis not present

## 2023-10-31 DIAGNOSIS — E8881 Metabolic syndrome: Secondary | ICD-10-CM | POA: Diagnosis not present

## 2023-10-31 DIAGNOSIS — E559 Vitamin D deficiency, unspecified: Secondary | ICD-10-CM | POA: Diagnosis not present

## 2023-10-31 DIAGNOSIS — R5383 Other fatigue: Secondary | ICD-10-CM | POA: Diagnosis not present

## 2023-11-01 DIAGNOSIS — K8020 Calculus of gallbladder without cholecystitis without obstruction: Secondary | ICD-10-CM | POA: Diagnosis not present

## 2023-11-03 DIAGNOSIS — K8020 Calculus of gallbladder without cholecystitis without obstruction: Secondary | ICD-10-CM | POA: Diagnosis not present

## 2023-11-16 ENCOUNTER — Other Ambulatory Visit: Payer: Self-pay | Admitting: Family Medicine

## 2023-11-16 NOTE — Telephone Encounter (Signed)
 Last office visit: 03/04/23 Next office visit: nothing scheduled Last refill:   cyclobenzaprine 10 mg tablet 09/20/23 90 tablets 1 refill

## 2023-11-17 DIAGNOSIS — K8500 Idiopathic acute pancreatitis without necrosis or infection: Secondary | ICD-10-CM | POA: Diagnosis not present

## 2023-11-17 DIAGNOSIS — Z860101 Personal history of adenomatous and serrated colon polyps: Secondary | ICD-10-CM | POA: Diagnosis not present

## 2023-11-17 DIAGNOSIS — Z09 Encounter for follow-up examination after completed treatment for conditions other than malignant neoplasm: Secondary | ICD-10-CM | POA: Diagnosis not present

## 2023-11-17 DIAGNOSIS — Z1330 Encounter for screening examination for mental health and behavioral disorders, unspecified: Secondary | ICD-10-CM | POA: Diagnosis not present

## 2023-12-14 DIAGNOSIS — Z9049 Acquired absence of other specified parts of digestive tract: Secondary | ICD-10-CM | POA: Diagnosis not present

## 2023-12-14 DIAGNOSIS — R5383 Other fatigue: Secondary | ICD-10-CM | POA: Diagnosis not present

## 2023-12-14 DIAGNOSIS — E618 Deficiency of other specified nutrient elements: Secondary | ICD-10-CM | POA: Diagnosis not present

## 2023-12-14 DIAGNOSIS — K859 Acute pancreatitis without necrosis or infection, unspecified: Secondary | ICD-10-CM | POA: Diagnosis not present

## 2023-12-14 DIAGNOSIS — K5730 Diverticulosis of large intestine without perforation or abscess without bleeding: Secondary | ICD-10-CM | POA: Diagnosis not present

## 2023-12-14 DIAGNOSIS — E559 Vitamin D deficiency, unspecified: Secondary | ICD-10-CM | POA: Diagnosis not present

## 2023-12-14 DIAGNOSIS — E8881 Metabolic syndrome: Secondary | ICD-10-CM | POA: Diagnosis not present

## 2023-12-28 DIAGNOSIS — R110 Nausea: Secondary | ICD-10-CM | POA: Diagnosis not present

## 2023-12-28 DIAGNOSIS — E8881 Metabolic syndrome: Secondary | ICD-10-CM | POA: Diagnosis not present

## 2023-12-28 DIAGNOSIS — E669 Obesity, unspecified: Secondary | ICD-10-CM | POA: Diagnosis not present

## 2023-12-28 DIAGNOSIS — R5383 Other fatigue: Secondary | ICD-10-CM | POA: Diagnosis not present

## 2024-02-23 DIAGNOSIS — N529 Male erectile dysfunction, unspecified: Secondary | ICD-10-CM | POA: Diagnosis not present

## 2024-02-23 DIAGNOSIS — E8881 Metabolic syndrome: Secondary | ICD-10-CM | POA: Diagnosis not present

## 2024-02-23 DIAGNOSIS — M6284 Sarcopenia: Secondary | ICD-10-CM | POA: Diagnosis not present

## 2024-02-23 DIAGNOSIS — R110 Nausea: Secondary | ICD-10-CM | POA: Diagnosis not present

## 2024-03-26 DIAGNOSIS — E559 Vitamin D deficiency, unspecified: Secondary | ICD-10-CM | POA: Diagnosis not present

## 2024-03-26 DIAGNOSIS — R7982 Elevated C-reactive protein (CRP): Secondary | ICD-10-CM | POA: Diagnosis not present

## 2024-03-26 DIAGNOSIS — E8881 Metabolic syndrome: Secondary | ICD-10-CM | POA: Diagnosis not present

## 2024-03-26 DIAGNOSIS — N529 Male erectile dysfunction, unspecified: Secondary | ICD-10-CM | POA: Diagnosis not present

## 2024-03-28 ENCOUNTER — Other Ambulatory Visit: Payer: Self-pay | Admitting: Family Medicine

## 2024-03-28 NOTE — Telephone Encounter (Signed)
 Sent with note to schedule OV.  Thanks.

## 2024-03-28 NOTE — Telephone Encounter (Signed)
 LOV:03/04/23 WUJ:[id number] scheduled LAST REFILL: zolpidem  10 mg tablet 09/20/23 30 tablets 5 refills

## 2024-04-04 DIAGNOSIS — R7982 Elevated C-reactive protein (CRP): Secondary | ICD-10-CM | POA: Diagnosis not present

## 2024-04-04 DIAGNOSIS — N529 Male erectile dysfunction, unspecified: Secondary | ICD-10-CM | POA: Diagnosis not present

## 2024-04-04 DIAGNOSIS — M6284 Sarcopenia: Secondary | ICD-10-CM | POA: Diagnosis not present

## 2024-04-04 DIAGNOSIS — E8881 Metabolic syndrome: Secondary | ICD-10-CM | POA: Diagnosis not present

## 2024-04-19 ENCOUNTER — Telehealth: Payer: Self-pay

## 2024-04-19 ENCOUNTER — Other Ambulatory Visit: Payer: Self-pay | Admitting: Family Medicine

## 2024-04-19 NOTE — Telephone Encounter (Signed)
 Please contact patient to scheduled yearly visit. Thank you.

## 2024-04-20 NOTE — Telephone Encounter (Signed)
 Called pt to set appt for cpe and pt stated that he will call next week when he come back in town

## 2024-05-01 NOTE — Telephone Encounter (Signed)
 Spoke to patient he states that he is heading out of town on emergency and will try and call when he returns, asked if he knew when he would be back and we could schedule for that. He states things have been hectic and he will call when he returns.

## 2024-05-01 NOTE — Telephone Encounter (Signed)
 Please try to reach out to patient to get him scheduled. Thank you

## 2024-05-09 NOTE — Telephone Encounter (Signed)
 Noted. Thanks.

## 2024-05-09 NOTE — Telephone Encounter (Signed)
Patient scheduled 06/28/24

## 2024-06-27 DIAGNOSIS — Z6829 Body mass index (BMI) 29.0-29.9, adult: Secondary | ICD-10-CM | POA: Diagnosis not present

## 2024-06-27 DIAGNOSIS — R7982 Elevated C-reactive protein (CRP): Secondary | ICD-10-CM | POA: Diagnosis not present

## 2024-06-27 DIAGNOSIS — N529 Male erectile dysfunction, unspecified: Secondary | ICD-10-CM | POA: Diagnosis not present

## 2024-06-27 DIAGNOSIS — E8881 Metabolic syndrome: Secondary | ICD-10-CM | POA: Diagnosis not present

## 2024-06-28 ENCOUNTER — Encounter: Payer: Self-pay | Admitting: Family Medicine

## 2024-06-28 ENCOUNTER — Ambulatory Visit (INDEPENDENT_AMBULATORY_CARE_PROVIDER_SITE_OTHER): Admitting: Family Medicine

## 2024-06-28 VITALS — BP 118/68 | HR 92 | Temp 97.8°F | Ht 71.73 in | Wt 212.8 lb

## 2024-06-28 DIAGNOSIS — Z0000 Encounter for general adult medical examination without abnormal findings: Secondary | ICD-10-CM

## 2024-06-28 DIAGNOSIS — I10 Essential (primary) hypertension: Secondary | ICD-10-CM | POA: Diagnosis not present

## 2024-06-28 DIAGNOSIS — Z7189 Other specified counseling: Secondary | ICD-10-CM

## 2024-06-28 DIAGNOSIS — M62838 Other muscle spasm: Secondary | ICD-10-CM

## 2024-06-28 DIAGNOSIS — G4700 Insomnia, unspecified: Secondary | ICD-10-CM

## 2024-06-28 LAB — COMPREHENSIVE METABOLIC PANEL WITH GFR
ALT: 11 U/L (ref 0–53)
AST: 13 U/L (ref 0–37)
Albumin: 4.2 g/dL (ref 3.5–5.2)
Alkaline Phosphatase: 57 U/L (ref 39–117)
BUN: 12 mg/dL (ref 6–23)
CO2: 31 meq/L (ref 19–32)
Calcium: 8.9 mg/dL (ref 8.4–10.5)
Chloride: 103 meq/L (ref 96–112)
Creatinine, Ser: 1.25 mg/dL (ref 0.40–1.50)
GFR: 64.76 mL/min (ref >60.00)
Glucose, Bld: 74 mg/dL (ref 70–99)
Potassium: 4 meq/L (ref 3.5–5.1)
Sodium: 139 meq/L (ref 135–145)
Total Bilirubin: 0.6 mg/dL (ref 0.2–1.2)
Total Protein: 7.2 g/dL (ref 6.0–8.3)

## 2024-06-28 LAB — LIPID PANEL
Cholesterol: 146 mg/dL (ref 0–200)
HDL: 47 mg/dL (ref >39.00)
LDL Cholesterol: 84 mg/dL (ref 0–99)
NonHDL: 99.4
Total CHOL/HDL Ratio: 3
Triglycerides: 75 mg/dL (ref 0.0–149.0)
VLDL: 15 mg/dL (ref 0.0–40.0)

## 2024-06-28 MED ORDER — ZOLPIDEM TARTRATE 10 MG PO TABS: 10.0000 mg | ORAL_TABLET | Freq: Every evening | ORAL | 5 refills | Status: AC | PRN

## 2024-06-28 MED ORDER — VALSARTAN 80 MG PO TABS: 120.0000 mg | ORAL_TABLET | Freq: Every day | ORAL | 3 refills | Status: AC

## 2024-06-28 MED ORDER — DILTIAZEM HCL ER COATED BEADS 240 MG PO CP24: 240.0000 mg | ORAL_CAPSULE | Freq: Every day | ORAL | 3 refills | Status: AC

## 2024-06-28 NOTE — Progress Notes (Signed)
 CPE- See plan.  Routine anticipatory guidance given to patient.  See health maintenance.  The possibility exists that previously documented standard health maintenance information may have been brought forward from a previous encounter into this note.  If needed, that same information has been updated to reflect the current situation based on today's encounter.    Tetanus 2017 Flu shot 2022 at work.  covid vaccine 2021 PNA not due.  Shingles d/w pt.  Colonoscopy 2020- d/w pt about follow up when possible.   Prostate cancer screening and PSA options (with potential risks and benefits of testing vs not testing) were discussed along with recent recs/guidelines.  He declined testing PSA at this point. Living will d/w pt.  He'll consider, d/w pt.   Diet and exercise d/w pt.   HCV screening done [facility] 2022.    Prev max weight 257, weight 212, taking zepbound.  No ADE on med.  D/w pt about strength training.    Hypertension:    Using medication without problems or lightheadedness: yes Chest pain with exertion:no Edema:no Short of breath:no Labs pending.   He had f/u labs out of clinic with normal HGB and A1c.  I asked him to update me about that.    Taking flexeril  prn.  No ADE on med.  It helps.     Insomnia.  Taking ambien  prn.  Work schedule d/w pt.  No ADE on med.  Shift work.    His father recently died, condolences offered. Wife had died prev. Living alone.  Son is out of state.    PMH and SH reviewed  Meds, vitals, and allergies reviewed.   ROS: Per HPI.  Unless specifically indicated otherwise in HPI, the patient denies:  General: fever. Eyes: acute vision changes ENT: sore throat Cardiovascular: chest pain Respiratory: SOB GI: vomiting GU: dysuria Musculoskeletal: acute back pain Derm: acute rash Neuro: acute motor dysfunction Psych: worsening mood Endocrine: polydipsia Heme: bleeding Allergy: hayfever  GEN: nad, alert and oriented HEENT: ncat NECK:  supple w/o LA CV: rrr. PULM: ctab, no inc wob ABD: soft, +bs EXT: no edema SKIN: well perfused.

## 2024-06-28 NOTE — Patient Instructions (Signed)
 Go to the lab on the way out.   If you have mychart we'll likely use that to update you.    Take care.  Glad to see you. I would get a flu shot each fall.   Please send me a copy of the outside labs.

## 2024-06-30 NOTE — Assessment & Plan Note (Signed)
Living will d/w pt.  He'll consider, d/w pt.

## 2024-06-30 NOTE — Assessment & Plan Note (Signed)
 He had f/u labs out of clinic with normal HGB and A1c.  I asked him to update me about that.  Continue as is.

## 2024-06-30 NOTE — Assessment & Plan Note (Signed)
Tetanus 2017 Flu shot 2022 at work.  covid vaccine 2021 PNA not due.  Shingles d/w pt.  Colonoscopy 2020- d/w pt about follow up when possible.   Prostate cancer screening and PSA options (with potential risks and benefits of testing vs not testing) were discussed along with recent recs/guidelines.  He declined testing PSA at this point. Living will d/w pt.  He'll consider, d/w pt.   Diet and exercise d/w pt.   HCV screening done via [facility] 2022.

## 2024-06-30 NOTE — Assessment & Plan Note (Signed)
 Prev max weight 257, weight 212, taking zepbound.  No ADE on med.  D/w pt about strength training.    He had f/u labs out of clinic with normal HGB and A1c.  I asked him to update me about that.    Continue diltiazem /valsartan  and as needed metoprolol .

## 2024-06-30 NOTE — Assessment & Plan Note (Signed)
 Taking ambien  prn.  Work schedule d/w pt.  No ADE on med.  Shift work. Continue as is.

## 2024-07-01 ENCOUNTER — Ambulatory Visit: Payer: Self-pay | Admitting: Family Medicine

## 2024-08-08 DIAGNOSIS — R7982 Elevated C-reactive protein (CRP): Secondary | ICD-10-CM | POA: Diagnosis not present

## 2024-08-08 DIAGNOSIS — E559 Vitamin D deficiency, unspecified: Secondary | ICD-10-CM | POA: Diagnosis not present

## 2024-08-08 DIAGNOSIS — I10 Essential (primary) hypertension: Secondary | ICD-10-CM | POA: Diagnosis not present

## 2024-08-08 DIAGNOSIS — E8881 Metabolic syndrome: Secondary | ICD-10-CM | POA: Diagnosis not present

## 2024-08-08 DIAGNOSIS — E618 Deficiency of other specified nutrient elements: Secondary | ICD-10-CM | POA: Diagnosis not present

## 2024-08-10 DIAGNOSIS — H43393 Other vitreous opacities, bilateral: Secondary | ICD-10-CM | POA: Diagnosis not present

## 2024-08-13 DIAGNOSIS — R7982 Elevated C-reactive protein (CRP): Secondary | ICD-10-CM | POA: Diagnosis not present

## 2024-08-13 DIAGNOSIS — E559 Vitamin D deficiency, unspecified: Secondary | ICD-10-CM | POA: Diagnosis not present

## 2024-08-13 DIAGNOSIS — R5383 Other fatigue: Secondary | ICD-10-CM | POA: Diagnosis not present
